# Patient Record
Sex: Female | Born: 1970 | Race: Black or African American | Hispanic: No | Marital: Single | Smoking: Never smoker
Health system: Southern US, Community
[De-identification: ages and names within clinical notes are randomized; demographics above are authoritative.]

## PROBLEM LIST (undated history)

## (undated) DIAGNOSIS — R053 Chronic cough: Secondary | ICD-10-CM

## (undated) DIAGNOSIS — E7439 Other disorders of intestinal carbohydrate absorption: Secondary | ICD-10-CM

## (undated) DIAGNOSIS — R600 Localized edema: Secondary | ICD-10-CM

## (undated) DIAGNOSIS — R05 Cough: Secondary | ICD-10-CM

## (undated) HISTORY — DX: Chronic cough: R05.3

## (undated) HISTORY — DX: Localized edema: R60.0

## (undated) HISTORY — DX: Other disorders of intestinal carbohydrate absorption: E74.39

## (undated) HISTORY — DX: Cough: R05

## (undated) HISTORY — DX: Morbid (severe) obesity due to excess calories: E66.01

---

## 1997-06-21 ENCOUNTER — Inpatient Hospital Stay (HOSPITAL_COMMUNITY): Admission: AD | Admit: 1997-06-21 | Discharge: 1997-06-23 | Payer: Self-pay | Admitting: *Deleted

## 1999-01-02 ENCOUNTER — Other Ambulatory Visit: Admission: RE | Admit: 1999-01-02 | Discharge: 1999-01-02 | Payer: Self-pay | Admitting: *Deleted

## 1999-05-18 ENCOUNTER — Ambulatory Visit (HOSPITAL_COMMUNITY): Admission: AD | Admit: 1999-05-18 | Discharge: 1999-05-18 | Payer: Self-pay | Admitting: Obstetrics and Gynecology

## 1999-05-20 ENCOUNTER — Encounter: Payer: Self-pay | Admitting: Obstetrics and Gynecology

## 1999-05-20 ENCOUNTER — Inpatient Hospital Stay (HOSPITAL_COMMUNITY): Admission: AD | Admit: 1999-05-20 | Discharge: 1999-05-20 | Payer: Self-pay | Admitting: Obstetrics and Gynecology

## 1999-05-21 ENCOUNTER — Inpatient Hospital Stay (HOSPITAL_COMMUNITY): Admission: AD | Admit: 1999-05-21 | Discharge: 1999-05-21 | Payer: Self-pay | Admitting: Obstetrics & Gynecology

## 1999-05-25 ENCOUNTER — Encounter (INDEPENDENT_AMBULATORY_CARE_PROVIDER_SITE_OTHER): Payer: Self-pay | Admitting: Specialist

## 1999-05-25 ENCOUNTER — Inpatient Hospital Stay (HOSPITAL_COMMUNITY): Admission: AD | Admit: 1999-05-25 | Discharge: 1999-05-29 | Payer: Self-pay | Admitting: *Deleted

## 1999-06-02 ENCOUNTER — Inpatient Hospital Stay (HOSPITAL_COMMUNITY): Admission: AD | Admit: 1999-06-02 | Discharge: 1999-06-03 | Payer: Self-pay | Admitting: Obstetrics and Gynecology

## 2000-06-05 ENCOUNTER — Encounter: Payer: Self-pay | Admitting: Obstetrics and Gynecology

## 2000-06-05 ENCOUNTER — Ambulatory Visit (HOSPITAL_COMMUNITY): Admission: RE | Admit: 2000-06-05 | Discharge: 2000-06-05 | Payer: Self-pay | Admitting: Obstetrics and Gynecology

## 2001-05-19 ENCOUNTER — Ambulatory Visit (HOSPITAL_COMMUNITY): Admission: RE | Admit: 2001-05-19 | Discharge: 2001-05-19 | Payer: Self-pay | Admitting: Gastroenterology

## 2001-08-29 ENCOUNTER — Other Ambulatory Visit: Admission: RE | Admit: 2001-08-29 | Discharge: 2001-08-29 | Payer: Self-pay | Admitting: Obstetrics and Gynecology

## 2002-02-20 ENCOUNTER — Other Ambulatory Visit: Admission: RE | Admit: 2002-02-20 | Discharge: 2002-02-20 | Payer: Self-pay | Admitting: Physical Therapy

## 2003-03-09 ENCOUNTER — Other Ambulatory Visit: Admission: RE | Admit: 2003-03-09 | Discharge: 2003-03-09 | Payer: Self-pay | Admitting: Obstetrics and Gynecology

## 2004-04-18 ENCOUNTER — Other Ambulatory Visit: Admission: RE | Admit: 2004-04-18 | Discharge: 2004-04-18 | Payer: Self-pay | Admitting: Obstetrics and Gynecology

## 2007-01-30 ENCOUNTER — Ambulatory Visit (HOSPITAL_COMMUNITY): Admission: RE | Admit: 2007-01-30 | Discharge: 2007-01-30 | Payer: Self-pay | Admitting: Obstetrics and Gynecology

## 2010-05-25 ENCOUNTER — Ambulatory Visit
Admission: RE | Admit: 2010-05-25 | Discharge: 2010-05-25 | Disposition: A | Discharge status: Home / Self Care | Payer: BC Managed Care – PPO | Source: Ambulatory Visit | Attending: Obstetrics and Gynecology | Admitting: Obstetrics and Gynecology

## 2010-05-25 ENCOUNTER — Other Ambulatory Visit: Payer: Self-pay | Admitting: Obstetrics and Gynecology

## 2010-05-25 DIAGNOSIS — R05 Cough: Secondary | ICD-10-CM

## 2010-09-08 NOTE — Discharge Summary (Signed)
[facility]  Patient:    Katrina Howe, Katrina Howe                      MRN: [id number] Adm. Date:  [id number] Disc. Date: [id number] Attending:  Maxie Better                           Discharge Summary  ADMISSION DIAGNOSES:          1. Wound infection.                               2. Severe iron deficiency anemia.                               3. Status post ruptured right ectopic pregnancy.  DISCHARGE DIAGNOSES:          1. Wound infection, resolving.                               2. Severe iron deficiency anemia.                               3. Status post ruptured right ectopic pregnancy.  HISTORY OF PRESENT ILLNESS:   This is a 40 year old, gravida 4, para 2-0-2-2, female, status post a diagnostic laparoscopy, exploratory laparotomy with bilateral salpingectomy on May 25, 1999, secondary to ruptured right ectopic pregnancy who presented to maternity admissions unit for evaluation of temperature of 100.8. The patient had persistent temperatures since 12 noon today. She was complaining of lower abdominal pain and right groin pain without any urinary symptoms or upper  respiratory tract infection.  HOSPITAL COURSE:              The patient was initially evaluated at maternity admissions unit at the time that she presented her temperature was 100.8. Complete physical examination was performed.  It was most notable for her abdomen which as soft, slightly obese.  Her infraumbilical incision was without erythema.  Her low transverse incision had Steri-Strips in place, but there was evidence of some purulence at a small pinpoint site.  This was probed with a wound culturet. Copious purulent material was obtained.  Pelvic examination showed her mons pubis without any erythema.  Hyperpigmented vulva without any edema.  Bimanual examination with no cervical motion tenderness, tender in the left lower quadrant. The right was nontender, but the  examination was limited due to the patients discomfort.  Her abdominal incision was injected with 1% lidocaine and opened approximately two inches with a scalpel.  Purulent material was noted.  The incision was irrigated with half strength hydrogen peroxide packed with Caltostat. CBC, urinalysis had been obtained.  The white count was 12.9, hemoglobin was 6.6, hematocrit 21.3, platelet count 595,000.  Urine showed a specific gravity of 1.027, trace ketones, no leukocyte esterase, and negative for nitrites.  The patient was started on intravenous Amoxicillin and gentamicin.  She continued on her Niferex 150 mg p.o. b.i.d. and was placed for 23-hour observation.  The patient on hospital day #2, noted resolution of her pain since the opening of her incision.  She became afebrile.  The wound culture subsequently revealed a few gram positive cocci, rare gram negative rods.  She was continued on her antibiotics.  The patient remained afebrile.  On June 03, 1999, having remained afebrile, the patient otherwise felt well.  She was complaining of some burning with urination.  Urine culture rom admission was negative.  The incision otherwise was unremarkable, had no drainage or erythema.  Good granulation tissue present.  The patient was deemed well for  discharge.  She was discharged home.  DISCHARGE MEDICATIONS:        1. Amoxicillin 500 mg p.o. t.i.d. for 10 days.                               2. Flagyl 375 mg one p.o. t.i.d. for 10 days.                               3. Continue on iron supplementation at home.  Dressing change from Home Health twice per day.  DISPOSITION:                  Home.  CONDITION ON DISCHARGE:       Stable.  FOLLOW-UP:                    Follow-up appointment in the coming week with Genia Del, M.D.  DISCHARGE INSTRUCTIONS:       Call for temperature greater than or equal to 100.4, increased incisional pain, purulent drainage, redness,  bleeding from her incision site, severe abdominal pain, nausea and vomiting. DD:  06/24/99 TD:  06/26/99 Job: [id number]

## 2010-09-08 NOTE — Op Note (Signed)
[facility]  Patient:    Katrina Howe                       MRN: [id number] Proc. Date: 05/25/99 Adm. Date:  [id number] Attending:  Ardeen Fillers                           Operative Report  PREOPERATIVE DIAGNOSIS:       Ruptured ectopic pregnancy status post methotrexate.  POSTOPERATIVE DIAGNOSIS:      Ruptured ectopic pregnancy status post methotrexate.                               Existing hemoperitoneum, 1100 cc.                               Left peritubal cyst.  OPERATION:                    Laparoscopy.  Bilateral salpingectomy via laparotomy.  SURGEON:                      Silverio Lay, M.D.  ASSISTANT:  ANESTHESIA:                   General with endotracheal intubation.  ESTIMATED BLOOD LOSS:         400 cc for total blood of 1500 cc  INDICATIONS:  DESCRIPTION OF PROCEDURE:     After being informed of the intent of right salpingectomy and left bilateral tubal ligation via laparoscopy with possible need for laparotomy, and being informed of the possible complications including bleeding, infection and trauma to the other organs, as well as failure rate for the tubal ligation of 1:250 to 1:500 with irreversibility informed consent was obtained. The patient was taken to OR #2, given general anesthesia and endotracheal intubation. She was placed in the lithotomy position, prepped and draped in the  sterile fashion.  A Foley catheter was inserted. A speculum was inserted and the anterior lip of the cervix was grasped to place an intrauterine manipulator.  A  1 cm incision was made in the umbilical area for insertion of the Verres needle and insufflation of 2.5 liters of CO2 for a maximum pressure of 50 mmHg.  Verres needle was removed and 10 mm trocar with laparoscope mounted on video were inserted. Observation:  It is possible to evaluate the pelvic anatomy at this time for there is too much clotted blood present.  A 10  mm incision was made in the suprapubic  area for insertion of a second 10 mm trocar under direct visualization and a 5 m trocar was inserted in the right lower quadrant under direct visualization.  We  started evacuating blood clots with the large suction to identify tube which was now actively bleeding. Visualization was very difficult.  Clotted blood was difficult to remove and it was impossible to safely proceed with the salpingectomy despite two short duration tries.  Decision was made to proceed with laparotomy due to the active bleeding seen by a laparoscopy.  All three trocars were removed.  Skin, subcutaneous tissue and fat were incised in a Pfannenstiel way over the suprapubic trocar incision.  Fascia was incised in a low transverse fashion. Linea alba was dissected and peritoneum was entered in the  midline fashion.  Clots were removed with suction in hand. The right tube was identified promptly clamped with two Kelly clamps.  Then clot and blood was removed with suction. The tube was completely excised and the tubal stalk was sutured with two transfixion sutures of 0 Vicryl.  Hemostasis was adequate.  A self retaining retractor was then placed. Bowels were retracted with large sponge and the left tube was then identified. We noted at this point the presence of a peritubal cyst at the fimbrial end of the  tube which was about 1 cm and decision was made to proceed with salpingectomy on that side also.  Mesosalpinx was then clamped with four Kelly clamps.  The tube was excised and all clamps were sutured with a transfix suture of 0 Vicryl. Hemostasis was assessed and adequate.  We proceeded to evacuate all clots with irrigation.  During that appendix was seen and normal.  All sponges were removed from the abdomen with the trocar.  Under fascia hemostasis was obtained with cautery. Fascia was closed with two running sutures of 0 Vicryl meeting at midline. Skin of all  incision was infiltrated with Marcaine 0.25.  Irrigated with saline and skin was then closed with subcuticular running suture of 4-0 Monocryl and Steri-Strips. The uterine instruments were then removed.  Estimated blood loss total is 1500 c which is 400 cc active loss during procedure.  Instrument and sponge count is complete times two.  The procedure is well tolerated by the patient who is taken to the recovery room in a well and stable condition. DD:  05/25/99 TD:  05/26/99 Job: [id number]

## 2010-09-08 NOTE — Discharge Summary (Signed)
[facility]  Patient:    Katrina Howe, Katrina Howe                      MRN: [id number] Adm. Date:  [id number] Disc. Date: [id number] Attending:  Maxie Better                           Discharge Summary  REASON FOR ADMISSION:         Suspected ruptured ectopic pregnancy.  HISTORY OF PRESENT ILLNESS:   This is a 40 year old black female, gravida 4, para 2, abortus 1, with a last menstrual period of April 12, 1999, with a known right ectopic pregnancy, treated with methotrexate on May 18, 1999.  Quantitative hCG was slowly decreasing with a quantitative on January 25, of 34,918, on January 8, 22,399, and on January 31, 17,554.  She was admitted complaining of slow onset lower abdominal pain which began earlier in the morning, felt as cramping in the right more than the left lower quadrant, radiating around the back and is continuous.  She denied any shoulder pain.  Her physical examination revealed positive rebound in the lower abdomen and impossible bimanual examination for reason of pain which was compatible with a ruptured ectopic pregnancy.  She was consented for emergency laparoscopy by Sung Amabile. Roslyn Smiling, M.D. and discussed with Sung Amabile. Roslyn Smiling, M.D. bilateral tubal ligation which was known to be irreversible with a failure rate of 1 in 250 to 1 in 500.  HOSPITAL COURSE:              She underwent laparoscopy with bilateral salpingectomy via laparotomy on May 25, 1999.  Preexisting hemoperitoneum and active bleeding interfering with possible laparoscopy.  The surgery went without complications and the estimated blood loss during the procedure was 400 cc with the preexisting hemoperitoneum of 1100.  The patient remained stable throughout the  procedure which was well tolerated.  Her postoperative course was uneventful despite her postoperative hemoglobin of 5.4.  That severe anemia was surprisingly well tolerated.  Her hemoglobin  remained stable and was again tested on May 28, 1999, at 5.7.  She was discharged on May 29, 1999, in a well and stable condition having a follow-up appointment with Sung Amabile. Roslyn Smiling, M.D. on May 26, 1999.  She received a Tylox prescription for pain control and Chromagen for anemia. She was instructed to call back if experiencing increased abdominal pain or fever.  DISCHARGE DIAGNOSES:          1. Ruptured ectopic pregnancy post methotrexate                                  treatment.                               2. Severe anemia.                               3. Sterilization per patient request.  CONDITION ON DISCHARGE:       Well and stable. DD:  09/04/99 TD:  09/05/99 Job: [id number]

## 2010-09-08 NOTE — Procedures (Signed)
[facility]  Patient:    Katrina Howe, Katrina Howe Visit Number: [id number] MRN: [id number]          Service Type: END Location: ENDO Attending Physician:  Louie Bun Dictated by:   Everardo All Madilyn Fireman, M.D. Proc. Date: 05/19/01 Admit Date:  05/19/2001                             Procedure Report  PROCEDURE:  Esophagogastroduodenoscopy with biopsy.  INDICATION FOR PROCEDURE:  Epigastric abdominal pain of one months duration with ulcer-like features, failing to respond to acid suppression.  DESCRIPTION OF PROCEDURE:  The patient was placed in the left lateral decubitus position and placed on the pulse monitor with continuous low-flow oxygen delivered by nasal cannula.  She was sedated with 50 mg IV Demerol and 5 mg IV Versed.  The Olympus video endoscope was advanced under direct vision into the oropharynx and esophagus.  The esophagus was straight and of normal caliber with the squamocolumnar line at 38 cm.  There was no visible hiatal hernia, ring, stricture, or other abnormality of the GE junction or distal esophagus.  The stomach was entered, and a small amount of liquid secretions were suctioned from the fundus.  Retroflexed view of the cardia was unremarkable.  The fundus and body appeared normal.  The antrum showed several focal erosions with small amounts of exudate and general patchy erythema and granularity consistent with a moderate gastritis.  No definite ulcer was seen, and none of the erosions had any significant depth or elevation surrounding the areas of exudate.  A CLOtest was obtained.  The pylorus was nondeformed and easily allowed passage of the endoscope tip into the duodenum.  Both the bulb and second portion were well-inspected and appeared to be within normal limits.  The scope was then withdrawn, and the patient returned to the recovery room in stable condition.  She tolerated the procedure well, and there were no immediate  complications.  IMPRESSION:  Moderate antral gastritis.  PLAN:  Await CLOtest and will either treat with proton pump inhibitor with avoidance of nonsteroidal anti-inflammatory drugs or antibiotics to treat Helicobacter if present. Dictated by:   Everardo All Madilyn Fireman, M.D. Attending Physician:  Louie Bun DD:  05/19/01 TD:  05/19/01 Job: [id number]

## 2013-09-23 ENCOUNTER — Ambulatory Visit: Payer: BC Managed Care – PPO | Admitting: Interventional Cardiology

## 2013-11-06 ENCOUNTER — Ambulatory Visit: Payer: BC Managed Care – PPO | Admitting: Interventional Cardiology

## 2013-12-18 ENCOUNTER — Ambulatory Visit (INDEPENDENT_AMBULATORY_CARE_PROVIDER_SITE_OTHER): Payer: BC Managed Care – PPO | Admitting: Interventional Cardiology

## 2013-12-18 ENCOUNTER — Encounter: Payer: Self-pay | Admitting: Interventional Cardiology

## 2013-12-18 VITALS — BP 124/66 | HR 60 | Ht 61.0 in | Wt 191.0 lb

## 2013-12-18 DIAGNOSIS — R079 Chest pain, unspecified: Secondary | ICD-10-CM | POA: Insufficient documentation

## 2013-12-18 DIAGNOSIS — E668 Other obesity: Secondary | ICD-10-CM

## 2013-12-18 DIAGNOSIS — E669 Obesity, unspecified: Secondary | ICD-10-CM

## 2013-12-18 HISTORY — DX: Chest pain, unspecified: R07.9

## 2013-12-18 NOTE — Progress Notes (Signed)
Patient ID: Katrina Howe, female   DOB: 1971/04/18, 43 y.o.   MRN: [id number]   Date: 12/18/2013 ID: Katrina Howe, DOB [birth year], MRN [id number] PCP: Turner Daniels, MD  Reason: Chest discomfort  ASSESSMENT;  1. exertional chest tightness, substernal 2. Obesity 3. History of glucose intolerance  PLAN:  1. stress Cardiolite   SUBJECTIVE: Katrina Howe is a 43 y.o. female who is here for evaluation of chest discomfort. It is characterized as a tightness in the substernal region. He can last up to hours. She denies palpitations. There is no orthopnea or PND. There is no peripheral edema or radiation of the discomfort to the neck or back. This symptom has been present and bothersome for approximately 12 months.  She's had a history of obesity. She has been on phentermine in the past. She denies peripheral edema, PE, and syncope.  She has never smoked cigarettes. There is a family history of coronary disease. A first cousin recently died of CAD at age 87.   Allergies  Allergen Reactions  . Iohexol      Code: HIVES, Desc: Pt.had omnipaque 300% IV injection (rt.antecubital site) around 12:35pm for CT CHEST,ABDOMEN,PELVIS scan. Left side of upper lip started to swell immediately after scan. Dr. Britta Mccreedy had prescibed and dosed pt. with . benadryl p.o. around 13, Onset Date: [id number]     No current outpatient prescriptions on file prior to visit.   No current facility-administered medications on file prior to visit.    No past medical history on file.  No past surgical history on file.  History   Social History  . Marital Status: Married    Spouse Name: N/A    Number of Children: N/A  . Years of Education: N/A   Occupational History  . Not on file.   Social History Main Topics  . Smoking status: Never Smoker   . Smokeless tobacco: Not on file  . Alcohol Use: Not on file  . Drug Use: Not on file  . Sexual Activity: Not on file   Other  Topics Concern  . Not on file   Social History Narrative  . No narrative on file    No family history on file.  ROS: Denies melena, musculoskeletal discomfort, connective tissue disease, joint discomfort, stroke, claudication, and kidney disease.. Other systems negative for complaints.  OBJECTIVE: BP 124/66  Pulse 60  Ht  (1.549 m)  Wt 191 lb (86.637 kg)  BMI 36.11 kg/m2,  General: No acute distress, obese but healthy HEENT: normal no pallor or jaundice Neck: JVD flat. Carotids absent bruits. 2+ symmetric upstroke. Chest: Clear Cardiac: Murmur: Normal. Gallop: Absent. Rhythm: Normal. Other: Normal Abdomen: Bruit: Absent. Pulsation: 2+ and symmetric Extremities: Edema: Absent. Pulses: 2+ and symmetric Neuro: Normal Psych: Normal  ECG: Normal sinus rhythm with nonspecific ST abnormality. Lateral Q waves raising the question of lateral infarction.

## 2013-12-18 NOTE — Patient Instructions (Signed)
Your physician has requested that you have an exercise stress myoview. For further information please visit [email or web address] Please follow instruction sheet, as given.  We will determine further follow-up plans after your myoview.

## 2014-01-01 ENCOUNTER — Telehealth: Payer: Self-pay | Admitting: Interventional Cardiology

## 2014-01-01 NOTE — Telephone Encounter (Signed)
Had left several messages to call and schedule Myoview.   Spoke with patient today and she stated she can' do until she has a day off work.

## 2015-05-10 ENCOUNTER — Ambulatory Visit (INDEPENDENT_AMBULATORY_CARE_PROVIDER_SITE_OTHER): Payer: BLUE CROSS/BLUE SHIELD | Admitting: Podiatry

## 2015-05-10 ENCOUNTER — Ambulatory Visit (INDEPENDENT_AMBULATORY_CARE_PROVIDER_SITE_OTHER): Payer: BLUE CROSS/BLUE SHIELD

## 2015-05-10 ENCOUNTER — Encounter: Payer: Self-pay | Admitting: Podiatry

## 2015-05-10 VITALS — BP 136/76 | HR 83 | Resp 16

## 2015-05-10 DIAGNOSIS — M722 Plantar fascial fibromatosis: Secondary | ICD-10-CM

## 2015-05-10 DIAGNOSIS — M79673 Pain in unspecified foot: Secondary | ICD-10-CM | POA: Diagnosis not present

## 2015-05-10 MED ORDER — MELOXICAM 15 MG PO TABS: 15.0000 mg | ORAL_TABLET | Freq: Every day | ORAL | Status: DC

## 2015-05-10 MED ORDER — METHYLPREDNISOLONE 4 MG PO TBPK: ORAL_TABLET | ORAL | Status: DC

## 2015-05-10 NOTE — Patient Instructions (Signed)

## 2015-05-10 NOTE — Progress Notes (Signed)
   Subjective:    Patient ID: Katrina Howe, female    DOB: [birth year], 45 y.o.   MRN: [id number]  HPI: She presents today with a 7 month duration of pain to the bilateral lower extremity. She states that the heel has been bothering her for the past several months left greater than right particularly in the mornings or after she's been sitting. She's done nothing to try to treat.    Review of Systems  Musculoskeletal: Positive for gait problem.  All other systems reviewed and are negative.      Objective:   Physical Exam: 45 year old female also stable alert and oriented 3 no apparent distress. Pulses are strongly palpable bilateral neurologic sensorium is intact per Semmes-Weinstein monofilament. Deep tendon reflexes are intact muscle strength +5 over 5 dorsiflexion plantar flexors and inverters everters all intrinsic musculature is intact. Orthopedic evaluation demonstrates pain on palpation medial calcaneal tubercle bilateral. She has no pain on medial and lateral compression of the calcaneus. Radiographs 3 views bilateral foot demonstrates an osseously mature individual with plantar distally oriented calcaneal heel spurs and a soft tissue increase in density at the plantar fascial calcaneal insertion site. Cutaneous evaluation demonstrates supple well-hydrated cutis no erythema edema cellulitis drainage or odor.        Assessment & Plan:  Assessment: Plantar fasciitis bilateral.  Plan: Started her on a Medrol Dosepak today to be followed by meloxicam. Injected the bilateral heels today with Kenalog and local anesthetic. Put her in a plantar fascial brace. She already has a night splint to utilize. We discussed appropriate shoe gear stretching exercises ice therapy and sugar modifications. I will follow-up with her in 1 month.

## 2015-06-09 ENCOUNTER — Encounter (INDEPENDENT_AMBULATORY_CARE_PROVIDER_SITE_OTHER): Payer: BLUE CROSS/BLUE SHIELD | Admitting: Podiatry

## 2015-06-09 NOTE — Progress Notes (Signed)
This encounter was created in error - please disregard.

## 2017-03-08 ENCOUNTER — Ambulatory Visit (INDEPENDENT_AMBULATORY_CARE_PROVIDER_SITE_OTHER): Payer: Managed Care, Other (non HMO) | Admitting: Podiatry

## 2017-03-08 ENCOUNTER — Ambulatory Visit (INDEPENDENT_AMBULATORY_CARE_PROVIDER_SITE_OTHER): Payer: Managed Care, Other (non HMO)

## 2017-03-08 ENCOUNTER — Ambulatory Visit: Payer: Managed Care, Other (non HMO)

## 2017-03-08 ENCOUNTER — Encounter: Payer: Self-pay | Admitting: Podiatry

## 2017-03-08 DIAGNOSIS — M25571 Pain in right ankle and joints of right foot: Secondary | ICD-10-CM

## 2017-03-08 DIAGNOSIS — M722 Plantar fascial fibromatosis: Secondary | ICD-10-CM | POA: Diagnosis not present

## 2017-03-08 MED ORDER — TRIAMCINOLONE ACETONIDE 10 MG/ML IJ SUSP
10.0000 mg | Freq: Once | INTRAMUSCULAR | Status: AC
  Administered 2017-03-08: 10 mg

## 2017-03-08 MED ORDER — DICLOFENAC SODIUM 75 MG PO TBEC: 75.0000 mg | DELAYED_RELEASE_TABLET | Freq: Two times a day (BID) | ORAL | 2 refills | Status: DC

## 2017-03-08 NOTE — Patient Instructions (Signed)

## 2017-03-13 NOTE — Progress Notes (Signed)
Subjective:    Patient ID: Katrina Howe Katrina Howe, female   DOB: 46 y.o.   MRN: [id number]   HPI patient states the bottom of the heels and become quite sore again and they continue to make it hard for me to walk    ROS      Objective:  Physical Exam neurovascular status intact with patient found have inflammatory changes plantar aspect of the heel bilateral with inflammation fluid around the medial band     Assessment:   Acute plantar fasciitis bilateral      Plan:   I injected the plantar fascia bilateral 3 mg Kenalog 5 mg Xylocaine and applied fascial brace bilateral gave instructions on physical therapy and supportive therapy

## 2017-03-29 ENCOUNTER — Ambulatory Visit: Payer: Managed Care, Other (non HMO) | Admitting: Podiatry

## 2017-10-02 ENCOUNTER — Encounter: Payer: Self-pay | Admitting: Cardiology

## 2017-11-22 ENCOUNTER — Encounter: Payer: Self-pay | Admitting: Cardiology

## 2017-11-22 ENCOUNTER — Ambulatory Visit (INDEPENDENT_AMBULATORY_CARE_PROVIDER_SITE_OTHER): Payer: Managed Care, Other (non HMO) | Admitting: Cardiology

## 2017-11-22 VITALS — BP 131/83 | HR 75 | Ht 61.0 in | Wt 216.6 lb

## 2017-11-22 DIAGNOSIS — R059 Cough, unspecified: Secondary | ICD-10-CM

## 2017-11-22 DIAGNOSIS — R609 Edema, unspecified: Secondary | ICD-10-CM | POA: Diagnosis not present

## 2017-11-22 DIAGNOSIS — R0609 Other forms of dyspnea: Secondary | ICD-10-CM | POA: Diagnosis not present

## 2017-11-22 DIAGNOSIS — R05 Cough: Secondary | ICD-10-CM | POA: Diagnosis not present

## 2017-11-22 DIAGNOSIS — R079 Chest pain, unspecified: Secondary | ICD-10-CM | POA: Diagnosis not present

## 2017-11-22 NOTE — Progress Notes (Signed)
PCP: Candice Camp, MD  Clinic Note: Chief Complaint  Patient presents with  . New Patient (Initial Visit)    chest pain, cough, swelling    HPI: Katrina Howe is a 47 y.o. female who is being seen today for the evaluation of CHEST PAIN, EDEMA & CHRONIC COUGH at the request of her OB Gyn- Candice Camp, MD.  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  She is concerned about CHF b/c her boyfriend was recently diagnosed with cardiomyopathy & is a patient of Dr. Gala Romney in the CHF clinic.  Her son also has diagnosis of left ventricular non-compaction  She was previously seen by Dr. Garnette Scheuermann in Aug 2015 for exertional CP (substernal tightness) -- with a PMH notable for obesity & glucose intolerance & a Fam Hx of CAD, he ordered a TM Myoview -- was never done  Katrina Howe was seen on June 11 by Dr. Rana Snare for her annual exam -- she noted concern for cough & pedal edema.  Also noted urinary incontinence & wgt gain. On ROS - noted occasional chest tightness - usually associated with her coughing & moving patients.   Recent Hospitalizations: none   Studies Personally Reviewed - (if available, images/films reviewed: From Epic Chart or Care Everywhere)  none  Interval History: Katrina Howe presents here today for a cardiology checkup because of some chest pain episodes that she is been having. Noted was a cough that is been ongoing for several months.  She says this is gotten better since she started taking Pepcid and Delsym.  She also was concerned because of increasing pedal edema and weight gain.  She says that her feet get swollen near the end of the day, but usually goes down the morning. She denies any PND orthopnea.  She also admits to probably not eating a very healthy diet, overeating and lack of exercise.  Basically with her busy work schedule between 2 jobs, she just does not cannot exercise.  She is always having to move  patient's equipment and sometimes has some tightness in her chest when she is doing this or when she is over exerting herself. She describes this pain as a tightness feeling, it goes across her upper chest to her shoulders.  It will last about 1 or 2 minutes.  There is some days worse worsen some days worse better.  Is not always associated with exertion.  Remainder of cardiac risk symptoms: No palpitations, lightheadedness, dizziness, weakness or syncope/near syncope. No TIA/amaurosis fugax symptoms. No claudication.  ROS: A comprehensive was performed. Review of Systems  HENT: Negative for congestion and nosebleeds.   Respiratory: Positive for cough (Much better with Pepcid and Delsym). Negative for shortness of breath.   Gastrointestinal: Negative for blood in stool and melena.  Genitourinary: Positive for urgency (Some urge incontinence with coughing and sneezing). Negative for hematuria.  Musculoskeletal: Negative for back pain and joint pain.  Neurological: Negative for dizziness and focal weakness.  Psychiatric/Behavioral: Negative.   All other systems reviewed and are negative.  I have reviewed and (if needed) personally updated the patient's problem list, medications, allergies, past medical and surgical history, social and family history.   Past Medical History:  Diagnosis Date  . Chronic cough   . Glucose intolerance   . Leg edema   . Morbid obesity (HCC)     History reviewed. No pertinent surgical history.  No outpatient medications have been marked  as taking for the 11/22/17 encounter (Office Visit) with Marykay Lex, MD.    Allergies  Allergen Reactions  . Iohexol      Code: HIVES, Desc: Pt.had omnipaque 300% IV injection (rt.antecubital site) around 12:35pm for CT CHEST,ABDOMEN,PELVIS scan. Left side of upper lip started to swell immediately after scan. Dr. Britta Mccreedy had prescibed and dosed pt. with 50mg . benadryl p.o. around 13, Onset Date: [id number]      Social History   Tobacco Use  . Smoking status: Never Smoker  . Smokeless tobacco: Never Used  Substance Use Topics  . Alcohol use: Yes    Comment: occasional  . Drug use: Never   Social History   Social History Narrative   She is a Lawyer - works 2 jobs.   No time to exercise.   Does not smoke.  Drinks alcohol socially.    family history includes Heart disease in her mother and son; Heart failure in her maternal grandmother.  Wt Readings from Last 3 Encounters:  11/22/17 216 lb 9.6 oz (98.2 kg)  12/18/13 191 lb (86.6 kg)    PHYSICAL EXAM BP 131/83 (BP Location: Left Arm)   Pulse 75   Ht 5\' 1"  (1.549 m)   Wt 216 lb 9.6 oz (98.2 kg)   BMI 40.93 kg/m  Physical Exam  Constitutional: She is oriented to person, place, and time. She appears well-developed and well-nourished. No distress.  By criteria, will be obese, but she does not have the classic body habitus of morbid obesity.  HENT:  Head: Normocephalic and atraumatic.  Mouth/Throat: No oropharyngeal exudate.  Eyes: Pupils are equal, round, and reactive to light. Conjunctivae and EOM are normal. No scleral icterus.  Neck: Normal range of motion. Neck supple. No hepatojugular reflux and no JVD present. Carotid bruit is not present.  Cardiovascular: Normal rate, regular rhythm, normal heart sounds and intact distal pulses.  No extrasystoles are present. PMI is not displaced (Difficult to assess). Exam reveals no gallop and no friction rub.  No murmur heard. Pulmonary/Chest: Effort normal and breath sounds normal. No respiratory distress. She has no wheezes. She has no rales. She exhibits tenderness (She does a couple areas of point tenderness along the sternal border.).  Abdominal: Soft. Bowel sounds are normal. She exhibits no distension. There is no tenderness. There is no rebound.  Musculoskeletal: Normal range of motion. She exhibits no edema.  Neurological: She is alert and oriented to person, place, and time. No  cranial nerve deficit.  Skin: Skin is warm and dry. No rash noted. No erythema.  Psychiatric: She has a normal mood and affect. Her behavior is normal. Judgment and thought content normal.  Vitals reviewed.    Adult ECG Report  Rate: 75 ;  Rhythm: normal sinus rhythm and Normal axis,  intervals, and durations;   Narrative Interpretation: Normal EKG   Other studies Reviewed: Additional studies/ records that were reviewed today include:  Recent Labs: Hemoglobin A1c was checked, however I cannot believe the results.  October 02, 2017: Lipid panel: TC 167, TG 170, HDL 53, LDL 93.  TSH 2.08.  Glucose 102. Na+ 138, K+ 4.3, Cl- 106, HCO3- 27, BUN 8, Cr 0.65, Glu 102, Ca2+ 8.9; AST 15, ALT 16, AlkP 64,      ASSESSMENT / PLAN: Problem List Items Addressed This Visit    Chest pain    Chest pain sounds very musculoskeletal in nature.  However she does have a family history of coronary disease, and has  glucose intolerance as well as obesity as risk factors.  Plan: Evaluate with GXT (GRADUAL EXERCISE TOLERANCE TEST)      Relevant Orders   EKG 12-Lead   ECHOCARDIOGRAM COMPLETE   EXERCISE TOLERANCE TEST (ETT)   Cough    Interestingly, this seems to have improved with the treatment of GERD as well as cough suppressant.  Again, to exclude CHF/cardiomyopathy, will check 2D echocardiogram.      Relevant Orders   EKG 12-Lead   ECHOCARDIOGRAM COMPLETE   EXERCISE TOLERANCE TEST (ETT)   DOE (dyspnea on exertion)    Exertional dyspnea is probably related to obesity and deconditioning.  I would like to build see her exercise tolerance, but she would also want to ensure no evidence of cardia myopathy. Plan: 2D echocardiogram and GXT.      Relevant Orders   EKG 12-Lead   ECHOCARDIOGRAM COMPLETE   EXERCISE TOLERANCE TEST (ETT)   Swelling - Primary   Relevant Orders   EKG 12-Lead   ECHOCARDIOGRAM COMPLETE   EXERCISE TOLERANCE TEST (ETT)     I spent a total of 30 minutes with the patient and  chart review. >  50% of the time was spent in direct patient consultation.   Current medicines are reviewed at length with the patient today.  (+/- concerns) n/a The following changes have been made:  n/a  Patient Instructions  Your physician has requested that you have an exercise tolerance test. For further information please visit [email or web address]. Please also follow instruction sheet, as given. -- done at Dr. Elissa HeftyHarding's office  Your physician has requested that you have an echocardiogram. Echocardiography is a painless test that uses sound waves to create images of your heart. It provides your doctor with information about the size and shape of your heart and how well your heart's chambers and valves are working. This procedure takes approximately one hour. There are no restrictions for this procedure. -- done at [address]  Your physician recommends that you schedule a follow-up appointment with Dr. Herbie BaltimoreHarding (first available after testing)   Studies Ordered:   Orders Placed This Encounter  Procedures  . EXERCISE TOLERANCE TEST (ETT)  . EKG 12-Lead  . ECHOCARDIOGRAM COMPLETE     Bryan Lemmaavid Keatin Benham, M.D., M.S. Interventional Cardiologist   Pager # [phone] Phone # [phone] [address] [place], [place] [address]   Thank you for choosing [facility]!!

## 2017-11-22 NOTE — Patient Instructions (Signed)
Your physician has requested that you have an exercise tolerance test. For further information please visit [email or web address]. Please also follow instruction sheet, as given. -- done at Dr. Elissa HeftyHarding's office  Your physician has requested that you have an echocardiogram. Echocardiography is a painless test that uses sound waves to create images of your heart. It provides your doctor with information about the size and shape of your heart and how well your heart's chambers and valves are working. This procedure takes approximately one hour. There are no restrictions for this procedure. -- done at [address]  Your physician recommends that you schedule a follow-up appointment with Dr. Herbie BaltimoreHarding (first available after testing)

## 2017-11-24 ENCOUNTER — Encounter: Payer: Self-pay | Admitting: Cardiology

## 2017-11-24 DIAGNOSIS — R0609 Other forms of dyspnea: Secondary | ICD-10-CM

## 2017-11-24 DIAGNOSIS — R05 Cough: Secondary | ICD-10-CM | POA: Insufficient documentation

## 2017-11-24 DIAGNOSIS — R059 Cough, unspecified: Secondary | ICD-10-CM

## 2017-11-24 DIAGNOSIS — R609 Edema, unspecified: Secondary | ICD-10-CM | POA: Insufficient documentation

## 2017-11-24 HISTORY — DX: Other forms of dyspnea: R06.09

## 2017-11-24 HISTORY — DX: Cough, unspecified: R05.9

## 2017-11-24 HISTORY — DX: Edema, unspecified: R60.9

## 2017-11-24 NOTE — Assessment & Plan Note (Signed)
Interestingly, this seems to have improved with the treatment of GERD as well as cough suppressant.  Again, to exclude CHF/cardiomyopathy, will check 2D echocardiogram.

## 2017-11-24 NOTE — Assessment & Plan Note (Signed)
Chest pain sounds very musculoskeletal in nature.  However she does have a family history of coronary disease, and has glucose intolerance as well as obesity as risk factors.  Plan: Evaluate with GXT (GRADUAL EXERCISE TOLERANCE TEST)

## 2017-11-24 NOTE — Assessment & Plan Note (Signed)
Exertional dyspnea is probably related to obesity and deconditioning.  I would like to build see her exercise tolerance, but she would also want to ensure no evidence of cardia myopathy. Plan: 2D echocardiogram and GXT.

## 2017-12-05 ENCOUNTER — Telehealth (HOSPITAL_COMMUNITY): Payer: Self-pay

## 2017-12-05 NOTE — Telephone Encounter (Signed)
Encounter complete. 

## 2017-12-06 ENCOUNTER — Telehealth (HOSPITAL_COMMUNITY): Payer: Self-pay

## 2017-12-06 NOTE — Telephone Encounter (Signed)
Encounter complete. 

## 2017-12-10 ENCOUNTER — Other Ambulatory Visit (HOSPITAL_COMMUNITY): Payer: Managed Care, Other (non HMO)

## 2017-12-10 ENCOUNTER — Ambulatory Visit (HOSPITAL_COMMUNITY)
Admission: RE | Admit: 2017-12-10 | Payer: Managed Care, Other (non HMO) | Source: Ambulatory Visit | Attending: Cardiology | Admitting: Cardiology

## 2017-12-10 DIAGNOSIS — R0989 Other specified symptoms and signs involving the circulatory and respiratory systems: Secondary | ICD-10-CM

## 2018-01-22 ENCOUNTER — Ambulatory Visit: Payer: Managed Care, Other (non HMO) | Admitting: Cardiology

## 2018-01-24 ENCOUNTER — Encounter (HOSPITAL_COMMUNITY): Payer: Self-pay | Admitting: Cardiology

## 2018-02-04 ENCOUNTER — Telehealth: Payer: Self-pay

## 2018-02-04 NOTE — Telephone Encounter (Signed)
New message    Just an FYI. We have made several attempts to contact this patient including sending a letter to schedule or reschedule their echocardiogram. We will be removing the patient from the echo WQ.   Thank you 

## 2018-02-06 NOTE — Telephone Encounter (Signed)
Patient cancelled 01/22/18 appointment to follow up appointment- ( patient did not have either test completed.)

## 2018-12-17 ENCOUNTER — Other Ambulatory Visit: Payer: Self-pay | Admitting: *Deleted

## 2018-12-17 DIAGNOSIS — Z20822 Contact with and (suspected) exposure to covid-19: Secondary | ICD-10-CM

## 2018-12-18 LAB — SPECIMEN STATUS REPORT

## 2018-12-18 LAB — NOVEL CORONAVIRUS, NAA: SARS-CoV-2, NAA: NOT DETECTED

## 2019-04-02 ENCOUNTER — Other Ambulatory Visit: Payer: Self-pay | Admitting: Physical Medicine and Rehabilitation

## 2019-04-02 DIAGNOSIS — G8929 Other chronic pain: Secondary | ICD-10-CM

## 2019-04-02 DIAGNOSIS — M545 Low back pain, unspecified: Secondary | ICD-10-CM

## 2019-04-13 ENCOUNTER — Telehealth: Payer: Self-pay

## 2019-04-13 NOTE — Telephone Encounter (Signed)
Phone call to patient to review instructions for 13 hr prep for Injection w/ contrast on 04/15/2019  at 0900. Prescription called into [facility]. Pt aware and verbalized understanding of instructions. Prescription: 04/14/19 at 8pm- 50mg  Prednisone 04/15/19 at 2 am- 50mg  Prednisone 04/15/19 at 8am - 50mg  Prednisone and 50mg  Benadryl

## 2019-04-14 ENCOUNTER — Telehealth: Payer: Self-pay

## 2019-04-14 NOTE — Telephone Encounter (Signed)
Patients appointment was moved from 04/15/2019 to 04/20/2019 at 0730AM. Called patient to review new times for taking 13 hour prep.   Prescription: 04/19/19 630AM- 50mg  Prednisone 04/20/19 1230AM- 50mg  Prednisone 04/20/19 630AM - 50mg  Prednisone and  50mg  Benadryl  Pt verbalized understanding.

## 2019-04-15 ENCOUNTER — Inpatient Hospital Stay: Admission: RE | Admit: 2019-04-15 | Payer: Self-pay | Source: Ambulatory Visit

## 2019-04-20 ENCOUNTER — Other Ambulatory Visit: Payer: Self-pay

## 2019-04-20 ENCOUNTER — Ambulatory Visit
Admission: RE | Admit: 2019-04-20 | Discharge: 2019-04-20 | Disposition: A | Discharge status: Home / Self Care | Payer: BC Managed Care – PPO | Source: Ambulatory Visit | Attending: Physical Medicine and Rehabilitation | Admitting: Physical Medicine and Rehabilitation

## 2019-04-20 DIAGNOSIS — G8929 Other chronic pain: Secondary | ICD-10-CM

## 2019-04-20 MED ORDER — IOPAMIDOL (ISOVUE-M 200) INJECTION 41%
1.0000 mL | Freq: Once | INTRAMUSCULAR | Status: AC
  Administered 2019-04-20: 1 mL via EPIDURAL

## 2019-04-20 MED ORDER — METHYLPREDNISOLONE ACETATE 40 MG/ML INJ SUSP (RADIOLOG
120.0000 mg | Freq: Once | INTRAMUSCULAR | Status: AC
  Administered 2019-04-20: 08:00:00 120 mg via EPIDURAL

## 2019-04-20 NOTE — Discharge Instructions (Signed)

## 2019-11-02 ENCOUNTER — Other Ambulatory Visit: Payer: Self-pay | Admitting: Sports Medicine

## 2019-11-02 DIAGNOSIS — M545 Low back pain, unspecified: Secondary | ICD-10-CM

## 2019-11-04 ENCOUNTER — Telehealth: Payer: Self-pay

## 2019-11-04 NOTE — Telephone Encounter (Signed)
LMOM for patient after phoning in 13-hr prep to [facility] in her epic chart.  Prednisone 50 mg PO 11/13/19 @ 0030, 0630 and 1230; Benadryl 50 mg PO @ 1230.

## 2019-11-13 ENCOUNTER — Inpatient Hospital Stay: Admission: RE | Admit: 2019-11-13 | Payer: BC Managed Care – PPO | Source: Ambulatory Visit

## 2020-05-14 IMAGING — XA Imaging study
2 series · 2 of 2 positions shown · non-contrast
Comparison: none

CLINICAL DATA: Lumbosacral spondylosis without myelopathy. Central
low back pain without radiculopathy.

[Series 1: ortho adipose · 1 of 1 slices shown (1 of 2)]
[im 1/1]
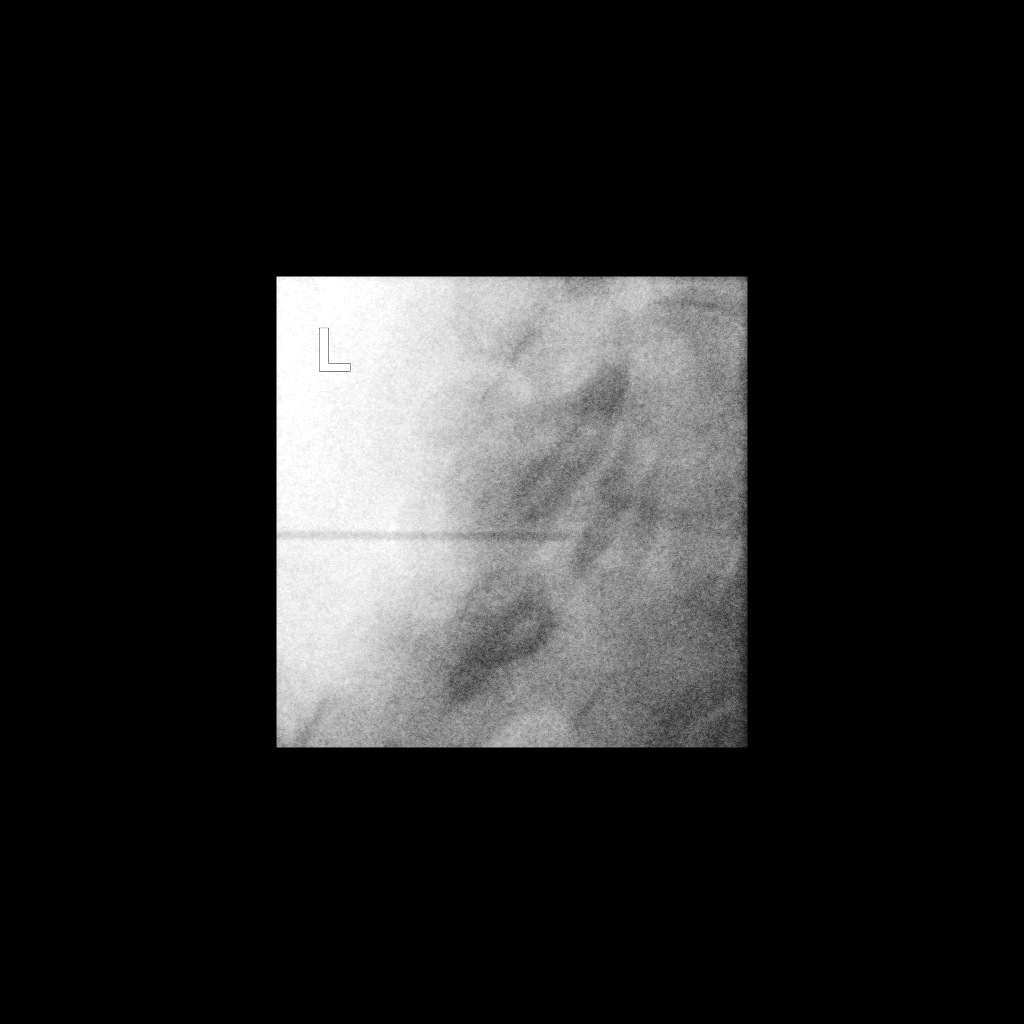

[Series 2: ortho adipose · 1 of 1 slices shown (2 of 2)]
[im 1/1]
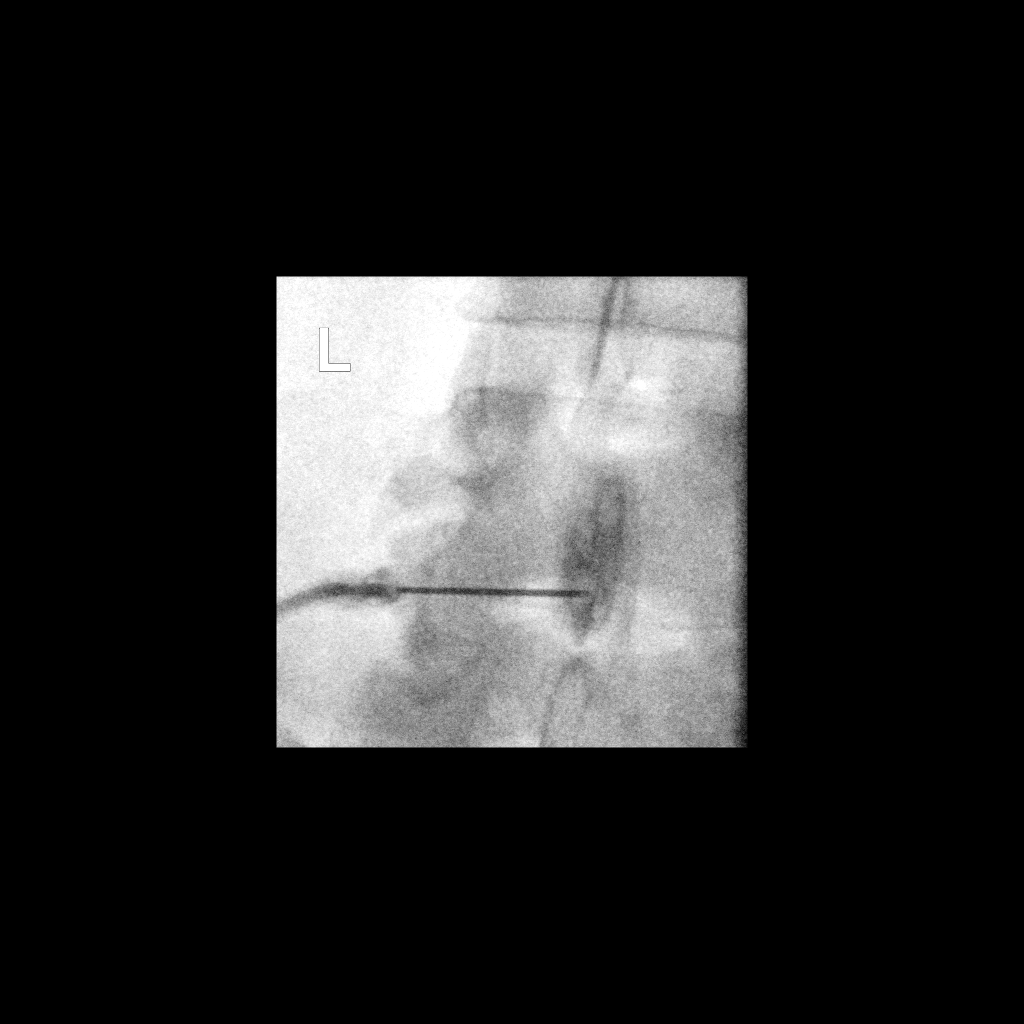

[2 of 2 positions shown; findings below may reference images not displayed]

FLUOROSCOPY TIME:  Radiation Exposure Index (as provided by the
fluoroscopic device): 3.6 mGy

Fluoroscopy Time:  2 seconds

Number of Acquired Images:  0

PROCEDURE:
The procedure, risks, benefits, and alternatives were explained to
the patient. Questions regarding the procedure were encouraged and
answered. The patient understands and consents to the procedure.

LUMBAR EPIDURAL INJECTION:

An interlaminar approach was performed on the left at L4-L5. The
overlying skin was cleansed and anesthetized. A 3.5 inch 20 gauge
epidural needle was advanced using loss-of-resistance technique.

DIAGNOSTIC EPIDURAL INJECTION:

Injection of Isovue-M 200 shows a good epidural pattern with spread
above and below the level of needle placement, primarily in the
midline. No vascular opacification is seen.

THERAPEUTIC EPIDURAL INJECTION:

120 mg of Depo-Medrol mixed with 3 mL of 1% lidocaine were
instilled. The procedure was well-tolerated, and the patient was
discharged thirty minutes following the injection in good condition.

COMPLICATIONS:
None immediate.
IMPRESSION: Technically successful interlaminar epidural injection on the left
at L4-L5.

## 2021-12-21 ENCOUNTER — Other Ambulatory Visit: Payer: Self-pay | Admitting: Obstetrics and Gynecology

## 2021-12-21 ENCOUNTER — Ambulatory Visit
Admission: RE | Admit: 2021-12-21 | Discharge: 2021-12-21 | Disposition: A | Discharge status: Home / Self Care | Payer: 59 | Source: Ambulatory Visit | Attending: Obstetrics and Gynecology | Admitting: Obstetrics and Gynecology

## 2021-12-21 DIAGNOSIS — R7611 Nonspecific reaction to tuberculin skin test without active tuberculosis: Secondary | ICD-10-CM

## 2022-06-09 LAB — LAB REPORT - SCANNED
A1c: 6.6
EGFR: 93

## 2022-08-17 ENCOUNTER — Ambulatory Visit: Payer: Self-pay | Admitting: Nurse Practitioner

## 2022-08-30 ENCOUNTER — Ambulatory Visit (INDEPENDENT_AMBULATORY_CARE_PROVIDER_SITE_OTHER): Payer: 59 | Admitting: Nurse Practitioner

## 2022-08-30 ENCOUNTER — Encounter: Payer: Self-pay | Admitting: Nurse Practitioner

## 2022-08-30 VITALS — BP 132/82 | HR 76 | Ht 62.5 in | Wt 221.2 lb

## 2022-08-30 DIAGNOSIS — N951 Menopausal and female climacteric states: Secondary | ICD-10-CM | POA: Diagnosis not present

## 2022-08-30 DIAGNOSIS — M25551 Pain in right hip: Secondary | ICD-10-CM

## 2022-08-30 DIAGNOSIS — E1165 Type 2 diabetes mellitus with hyperglycemia: Secondary | ICD-10-CM

## 2022-08-30 DIAGNOSIS — E668 Other obesity: Secondary | ICD-10-CM | POA: Diagnosis not present

## 2022-08-30 MED ORDER — OZEMPIC (0.25 OR 0.5 MG/DOSE) 2 MG/3ML ~~LOC~~ SOPN: 0.5000 mg | PEN_INJECTOR | SUBCUTANEOUS | 0 refills | Status: DC

## 2022-08-30 NOTE — Progress Notes (Signed)
Tollie Eth, DNP, AGNP-c [facility] [address] [place], [place] [address] Main Office [phone]   New patient visit   Patient: Katrina Howe   DOB: 06-11-1970   52 y.o. Female  MRN: [id number] Visit Date: 08/30/2022  Patient Care Team: Tollie Eth, NP as PCP - General (Nurse Practitioner)  Today's Vitals   08/30/22 1525  BP: 132/82  Pulse: 76  SpO2: 98%  Weight: 221 lb 3.2 oz (100.3 kg)  Height: 5' 2.5" (1.588 m)   Body mass index is 39.81 kg/m.   Today's healthcare provider: Tollie Eth, NP   Chief Complaint  Patient presents with   other    New pt. Est. And diabetes, weight loss, rt. Hip pain that radiates down legs, drinks a lot of soda daily does not drink water,    Subjective    Katrina Howe is a 52 y.o. female who presents today as a new patient to establish care.  Katrina Howe expresses concerns today with tiredness and possible menopausal symptoms like hot flashes and sweating. She feels like this have been worse over the last 2-3 days. She has a history of uterine ablation and had cessation of menses until she received her COVID vaccines. Since that time she has had some menstrual bleeding. She tells me she has been advised by Dr. Rana Snare with OB GYN to undergo a hysterectomy due to fibroids, rectocele, and other issues.   Katrina Howe also expresses feelings of depressed moods intermittently, which she feels relates to perimenopause and her weight.    She reports a diagnosis of diabetes. She tells me that she tends to drink a large amount of soda and very little water intake. She has also experienced weight gain, which is concerning for her. She discusses the challenges of managing her weight with her diabetes diagnosis.   Katrina Howe also reports concerns with leg cramping on a regular basis.   Katrina Howe reports discomfort of the right hip and buttocks. She is hoping that weight loss will help with this.     History  reviewed and reveals the following: Past Medical History:  Diagnosis Date   Chest pain 12/18/2013   Chronic cough    Cough 11/24/2017   DOE (dyspnea on exertion) 11/24/2017   Glucose intolerance    Leg edema    Morbid obesity (HCC)    Swelling 11/24/2017   No past surgical history on file. Family Status  Relation Name Status   Mother  Alive   Father  Other       Unknown   MGM  Alive   Son  Alive   Family History  Problem Relation Age of Onset   Heart disease Mother    Heart failure Maternal Grandmother    Heart disease Son        Left ventricular non-compaction   Social History   Socioeconomic History   Marital status: Single    Spouse name: Not on file   Number of children: Not on file   Years of education: Not on file   Highest education level: Not on file  Occupational History    Employer: [employer]  Tobacco Use   Smoking status: Never   Smokeless tobacco: Never  Substance and Sexual Activity   Alcohol use: Yes    Comment: occasional   Drug use: Never   Sexual activity: Yes    Partners: Male  Other Topics Concern   Not on file  Social History Narrative  She is a Lawyer - works 2 jobs.   No time to exercise.   Does not smoke.  Drinks alcohol socially.   Social Determinants of Health   Financial Resource Strain: Not on file  Food Insecurity: Not on file  Transportation Needs: Not on file  Physical Activity: Not on file  Stress: Not on file  Social Connections: Not on file   Outpatient Medications Prior to Visit  Medication Sig   [DISCONTINUED] celecoxib (CELEBREX) 200 MG capsule celecoxib 200 mg capsule (Patient not taking: Reported on 08/30/2022)   [DISCONTINUED] ergocalciferol (VITAMIN D2) 1.25 MG (50000 UT) capsule ergocalciferol (vitamin D2) 1,250 mcg (50,000 unit) capsule (Patient not taking: Reported on 08/30/2022)   [DISCONTINUED] fluconazole (DIFLUCAN) 150 MG tablet Take 150 mg by mouth 2 (two) times a week. (Patient not taking:  Reported on 08/30/2022)   [DISCONTINUED] hydrochlorothiazide (HYDRODIURIL) 25 MG tablet hydrochlorothiazide 25 mg tablet (Patient not taking: Reported on 08/30/2022)   [DISCONTINUED] metFORMIN (GLUCOPHAGE) 500 MG tablet metformin 500 mg tablet  TK 1 T PO BID (Patient not taking: Reported on 08/30/2022)   [DISCONTINUED] pantoprazole (PROTONIX) 40 MG tablet pantoprazole 40 mg tablet,delayed release  TAKE 1 TABLET BY MOUTH EVERY DAY (Patient not taking: Reported on 08/30/2022)   [DISCONTINUED] traMADol (ULTRAM) 50 MG tablet tramadol 50 mg tablet  TAKE 1 TABLET BY MOUTH EVERY 6 TO 8 HOURS (Patient not taking: Reported on 08/30/2022)   No facility-administered medications prior to visit.   Allergies  Allergen Reactions   Iohexol Hives and Swelling     Code: HIVES, Desc: Pt.had omnipaque 300% IV injection (rt.antecubital site) around 12:35pm for CT CHEST,ABDOMEN,PELVIS scan. Left side of upper lip started to swell immediately after scan. Dr. Britta Mccreedy had prescibed and dosed pt. with 50mg . benadryl p.o. around 13, Onset Date: [id number]     There is no immunization history on file for this patient.  Health Maintenance Due Health Maintenance Topics with due status: Overdue     Topic Date Due   COVID-19 Vaccine Never done   FOOT EXAM Never done   OPHTHALMOLOGY EXAM Never done   HIV Screening Never done   Diabetic kidney evaluation - Urine ACR Never done   Hepatitis C Screening Never done   DTaP/Tdap/Td Never done   PAP SMEAR-Modifier Never done   Colonoscopy Never done   MAMMOGRAM Never done   Zoster Vaccines- Shingrix Never done    Review of Systems All review of systems negative except what is listed in the HPI   Objective    BP 132/82   Pulse 76   Ht 5' 2.5" (1.588 m)   Wt 221 lb 3.2 oz (100.3 kg)   SpO2 98%   BMI 39.81 kg/m  Physical Exam Vitals and nursing note reviewed.  Constitutional:      General: She is not in acute distress.    Appearance: Normal appearance. She  is well-developed. She is obese.  HENT:     Head: Normocephalic and atraumatic.  Eyes:     General: No scleral icterus.    Conjunctiva/sclera: Conjunctivae normal.     Pupils: Pupils are equal, round, and reactive to light.  Neck:     Thyroid: No thyromegaly.     Vascular: No carotid bruit.  Cardiovascular:     Rate and Rhythm: Normal rate and regular rhythm.     Pulses: Normal pulses.     Heart sounds: Normal heart sounds. No murmur heard. Pulmonary:     Effort: Pulmonary effort is  normal. No respiratory distress.     Breath sounds: Normal breath sounds. No wheezing, rhonchi or rales.  Abdominal:     General: Bowel sounds are normal. There is no distension.     Palpations: Abdomen is soft.     Tenderness: There is no abdominal tenderness. There is no right CVA tenderness, left CVA tenderness or guarding.  Musculoskeletal:        General: Tenderness present.     Cervical back: Neck supple.     Right lower leg: No edema.     Left lower leg: No edema.     Comments: Right trochanter tenderness noted with additional tenderness with deep palpation to the piriformis.   Lymphadenopathy:     Cervical: No cervical adenopathy.  Skin:    General: Skin is warm and dry.     Capillary Refill: Capillary refill takes less than 2 seconds.     Findings: No rash.  Neurological:     Mental Status: She is alert and oriented to person, place, and time. Mental status is at baseline.  Psychiatric:        Mood and Affect: Mood normal.        Behavior: Behavior normal.   Treatment with  No results found for any visits on 08/30/22.  Assessment & Plan      Problem List Items Addressed This Visit     Moderate obesity    Concern with weight management in the setting of diabetes.  Discussion today on management of diet and exercise as well as utilization of medications to help with management. Plan: -Plan to start Ozempic today for management of both diabetes and weight. -Freestyle libre monitor  placed today to help with monitoring of blood sugar levels to assist in diabetes and weight control -Dietary habits and portion control discussed. -Information provided on meal planning.       Relevant Medications   Semaglutide,0.25 or 0.5MG /DOS, (OZEMPIC, 0.25 OR 0.5 MG/DOSE,) 2 MG/3ML SOPN   Type 2 diabetes mellitus with hyperglycemia (HCC) - Primary    Diagnosis of diabetes, not currently on medication therapy. Historically she has been on metformin, but was unable to tolerate the medication and has therefore stopped. Extensive discussion today on the pathophysiology and treatment of this condition including diet and exercise, as well as medications. At this time, there is concern with the amount of soda and lack of water that she is consuming contributing to her symptoms and her weight.  Plan: - Initiate empiric for diabetes management. -Encourage reduction of soda intake by 1/day each week and a strong focus on portion control -Advised on the importance of hydration and suggest alternatives like bubbly water or adding Crystal light packs to water to help increase intake -Freestyle libre continuous glucose monitor applied to help patient monitor blood sugar levels and understand the impact of dietary choices on blood sugar -Education provided on the mechanism of diabetes and the role of medication in managing hunger signals and blood sugar levels.      Relevant Medications   Semaglutide,0.25 or 0.5MG /DOS, (OZEMPIC, 0.25 OR 0.5 MG/DOSE,) 2 MG/3ML SOPN   Perimenopause    Reported symptoms of hot flashes, night sweats, and mood changes that may indicate hormonal fluctuations associated with perimenopause. Menses are not regular due to history of ablation. She has additional diagnoses of fibroids and rectocele with recommendation for hysterectomy.  Plan: - Recommend discussion with GYN for evaluation of symptom management and recommendations       Pain of right  hip    Right hip pain  consistent with trochanteric bursitis.  The pain is exacerbated by laying on the right side.  She also has some symptoms consistent with piriformis syndrome. Plan: -Stretches provided -Consider using tennis ball for self massage to help alleviate symptoms -Weight loss management initiated today to help potentially reduce current symptoms -Will consider orthopedic management if symptoms worsen or fail to improve with treatment.        Return in about 4 weeks (around 09/27/2022) for virtual med check.    Time: 71 minutes, >50% spent counseling, care coordination, chart review, and documentation.    Marcelino Campos, Sung Amabile, NP, DNP, AGNP-C [facility]

## 2022-08-30 NOTE — Patient Instructions (Addendum)
Lets get you started on Ozempic.   2 hours after you have eaten you want your blood sugar less than 150.  When you wake up you want your blood sugar to be between 80-120.   Your goal over the next month: decrease your soda intake by 1 soda/day a week.   DIABETES High blood sugar can damage your organs, blood vessels, and nerves, slow wound healing, and increase your risk of infection, among many other things.  The risk of having a heart attack and/or stroke is [facility] higher if you have diabetes with uncontrolled blood sugars.  To help reduce your risks and keep you healthy, we must work together to get your blood sugar levels under control with diet, exercise, and medication.    The most important and effective way to control diabetes are through diet changes and regular exercise.   What is Happening With Diabetes? Foods high in carbohydrates (sugars, starches, bread, pasta, potatoes, soda, fruit juices, etc) break down into a sugar called glucose once in your body. Glucose is used by the cells in your body for fuel to have the energy they need to work properly.  As the glucose is released into your blood stream during digestion, your blood sugar goes up. This is called hyperglycemia.   Insulin is a hormone in the body that works as a key to unlock the cell and allow the glucose in.  Normally, insulin is released in response to rising blood glucose levels.   In people with diabetes, either the cells have changed the locks and don't open with the insulin key or there is not enough insulin made by the body to use all of the glucose in the blood.   This means that the glucose never makes it into the cells for fuel and stays in the blood. The high levels of glucose in the blood are like a poison to your organs and blood vessels and over time permanent damage starts to occur.  What Kind of Diet is Best for Diabetes? A person with diabetes must limit the amount of carbohydrates and sugar eaten to  help prevent high blood glucose levels.     You should aim for less than 1/2 of your total calorie intake per day to come from carbohydrates.  What this means is, if you eat a 1200-1500 calorie diet, you will want less than 600-750 of those calories to come from carbohydrates. That equals to about 150-200 grams of carbohydrates per day.   GOAL: Eat 1200-1500 calories a day with 150-180 grams of carbohydrates.   Reading labels is very important to help understand how many carbohydrates are in certain foods. There are also tables available online for restaurant foods that may help when you are eating out.   Important foods to INCREASE in your diet are lean meats, protein, and vegetables. It can be very helpful to measure the food you are eating by the recommended serving size on packages to make sure you are not overeating and monitor your calories and carbohydrates.   How Does Exercise Help with Diabetes? When you exercise, the cells in your body use up more energy. This means that they need more fuel to keep going. The cells that can still use the insulin key, take in more of the glucose from the blood for energy and the blood glucose levels go down.   Excess fat cells can be the cause of the insulin key no longer working. Exercise and weight loss can change the  locks back to allow the insulin key to work again.   Enough weight loss can sometimes get rid of diabetes!  What Kind of Exercise is Best for Diabetes? I recommend starting out with moderate exercise, like walking.  Walking every single day for at least 15-20 minutes can be enough to get you up and moving without wearing you out.  You want to walk at a pace that you can carry on a conversation without being too out of breath, but that you also get your heart rate up and break a sweat.  As you get used to daily walking, you should increase how far, how fast, and how long you walk.    Monitoring your blood sugars helps you have an  understanding of how your diet and activity levels are affecting your blood sugar. Certain foods that you think may not increase your blood sugar really make a difference. By monitoring your sugar when you eat, you can see how different foods affect your numbers. This is very important when you first start treatment to get a good understanding of what foods are good and what foods you should limit.   I would like you to monitor your blood sugar every morning before eating and write down the number to bring with you to your next visit. This will help [place] determine if we need to make changes to the medication and diet.   You may also want to check your blood sugar after meals to see how certain foods are affecting the numbers. Excellent blood sugar goals are between 90-120 when you have not eaten and less than 160 1-2 hours after a meal. If you are checking your blood sugars 1-2 hours after a meal and they are higher than this, this tells [place] that we need better control. If you are waking up with blood sugars above 120, we need to look at your diet the day before to see if this could be affecting the numbers.   Medication is the key to help control your blood sugars. With diabetes, your body is not using insulin like it should to help the glucose (sugar) get into the cells for fuel. Medications help your body produce more insulin and make your cells more receptive to insulin so that the glucose in your body can be used instead of remaining in the blood.  The first line medication is called Metformin. This is a pill that you take daily, usually twice a day, to help your body properly use glucose. If good control is not achieved with metformin, other medications can be added or changed to help with better control.   Management of cholesterol is vital to reduce your risks of heart attack and stroke. Medication to control your cholesterol is also very important and necessary for your overall health.  As a new  diabetic, we will plan to follow-up every 3 months to check your hemoglobin A1c, which gives me an average of your blood sugars over the past 3 months to help determine your control. Once your blood sugars are well controlled, we can go down to checking every 6 months.  We also need to closely monitor your feet, kidney function, and cholesterol as these are all affected from diabetes.   WEIGHT LOSS PLANNING Your progress today shows:     08/30/2022    3:25 PM 04/20/2019    8:20 AM 11/22/2017    2:06 PM  Vitals with BMI  Height 5' 2.5"    Weight 221  lbs 3 oz    BMI 39.79    Systolic 132 140 782  Diastolic 82 67 83  Pulse 76 73 75    For best management of weight, it is vital to balance intake versus output. This means the number of calories burned per day must be less than the calories you take in with food and drink.   I recommend trying to follow a diet with the following: Calories: 1200-1500 calories per day Carbohydrates: 150-180 grams of carbohydrates per day  Why: Gives your body enough "quick fuel" for cells to maintain normal function without sending them into starvation mode.  Protein: At least 90 grams of protein per day- 30 grams with each meal Why: Protein takes longer and uses more energy than carbohydrates to break down for fuel. The carbohydrates in your meals serves as quick energy sources and proteins help use some of that extra quick energy to break down to produce long term energy. This helps you not feel hungry as quickly and protein breakdown burns calories.  Water: Drink AT LEAST 64 ounces of water per day  Why: Water is essential to healthy metabolism. Water helps to fill the stomach and keep you fuller longer. Water is required for healthy digestion and filtering of waste in the body.  Fat: Limit fats in your diet- when choosing fats, choose foods with lower fats content such as lean meats (chicken, fish, [place]).  Why: Increased fat intake leads to storage "for  later". Once you burn your carbohydrate energy, your body goes into fat and protein breakdown mode to help you loose weight.  Cholesterol: Fats and oils that are LIQUID at room temperature are best. Choose vegetable oils (olive oil, avocado oil, nuts). Avoid fats that are SOLID at room temperature (animal fats, processed meats). Healthy fats are often found in whole grains, beans, nuts, seeds, and berries.  Why: Elevated cholesterol levels lead to build up of cholesterol on the inside of your blood vessels. This will eventually cause the blood vessels to become hard and can lead to high blood pressure and damage to your organs. When the blood flow is reduced, but the pressure is high from cholesterol buildup, parts of the cholesterol can break off and form clots that can go to the brain or heart leading to a stroke or heart attack.  Fiber: Increase amount of SOLUBLE the fiber in your diet. This helps to fill you up, lowers cholesterol, and helps with digestion. Some foods high in soluble fiber are oats, peas, beans, apples, carrots, barley, and citrus fruits.   Why: Fiber fills you up, helps remove excess cholesterol, and aids in healthy digestion which are all very important in weight management.   I recommend the following as a minimum activity routine: Purposeful walk or other physical activity at least 20 minutes every single day. This means purposefully taking a walk, jog, bike, swim, treadmill, elliptical, dance, etc.  This activity should be ABOVE your normal daily activities, such as walking at work. Goal exercise should be at least 150 minutes a week- work your way up to this.   Heart Rate: Your maximum exercise heart rate should be 220 - Your Age in Years. When exercising, get your heart rate up, but avoid going over the maximum targeted heart rate.  60-70% of your maximum heart rate is where you tend to burn the most fat. To find this number:  220 - Age In Years= Max HR  Max HR x 0.6 (or  0.7) =  Fat Burning HR The Fat Burning HR is your goal heart rate while working out to burn the most fat.  NEVER exercise to the point your feel lightheaded, weak, nauseated, dizzy. If you experience ANY of these symptoms- STOP exercise! Allow yourself to cool down and your heart rate to come down. Then restart slower next time.  If at ANY TIME you feel chest pain or chest pressure during exercise, STOP IMMEDIATELY and seek medical attention.

## 2022-09-04 ENCOUNTER — Telehealth: Payer: Self-pay | Admitting: Nurse Practitioner

## 2022-09-04 NOTE — Telephone Encounter (Signed)
Katrina Howe (Key: [id number]) Rx #: [id number] Ozempic (0.25 or 0.5 MG/DOSE) 2MG /3ML pen-injectors

## 2022-09-18 ENCOUNTER — Encounter: Payer: Self-pay | Admitting: Nurse Practitioner

## 2022-09-18 DIAGNOSIS — E1165 Type 2 diabetes mellitus with hyperglycemia: Secondary | ICD-10-CM | POA: Insufficient documentation

## 2022-09-18 DIAGNOSIS — M25551 Pain in right hip: Secondary | ICD-10-CM | POA: Insufficient documentation

## 2022-09-18 DIAGNOSIS — N951 Menopausal and female climacteric states: Secondary | ICD-10-CM | POA: Insufficient documentation

## 2022-09-18 NOTE — Assessment & Plan Note (Signed)
Diagnosis of diabetes, not currently on medication therapy. Historically she has been on metformin, but was unable to tolerate the medication and has therefore stopped. Extensive discussion today on the pathophysiology and treatment of this condition including diet and exercise, as well as medications. At this time, there is concern with the amount of soda and lack of water that she is consuming contributing to her symptoms and her weight.  Plan: - Initiate empiric for diabetes management. -Encourage reduction of soda intake by 1/day each week and a strong focus on portion control -Advised on the importance of hydration and suggest alternatives like bubbly water or adding Crystal light packs to water to help increase intake -Freestyle libre continuous glucose monitor applied to help patient monitor blood sugar levels and understand the impact of dietary choices on blood sugar -Education provided on the mechanism of diabetes and the role of medication in managing hunger signals and blood sugar levels.

## 2022-09-18 NOTE — Assessment & Plan Note (Signed)
Right hip pain consistent with trochanteric bursitis.  The pain is exacerbated by laying on the right side.  She also has some symptoms consistent with piriformis syndrome. Plan: -Stretches provided -Consider using tennis ball for self massage to help alleviate symptoms -Weight loss management initiated today to help potentially reduce current symptoms -Will consider orthopedic management if symptoms worsen or fail to improve with treatment.

## 2022-09-18 NOTE — Assessment & Plan Note (Signed)
Concern with weight management in the setting of diabetes.  Discussion today on management of diet and exercise as well as utilization of medications to help with management. Plan: -Plan to start Ozempic today for management of both diabetes and weight. -Freestyle libre monitor placed today to help with monitoring of blood sugar levels to assist in diabetes and weight control -Dietary habits and portion control discussed. -Information provided on meal planning.

## 2022-09-18 NOTE — Assessment & Plan Note (Signed)
Reported symptoms of hot flashes, night sweats, and mood changes that may indicate hormonal fluctuations associated with perimenopause. Menses are not regular due to history of ablation. She has additional diagnoses of fibroids and rectocele with recommendation for hysterectomy.  Plan: - Recommend discussion with GYN for evaluation of symptom management and recommendations

## 2022-10-08 NOTE — Telephone Encounter (Signed)
No response from P.A.  Resubmitted with chart notes & was approved til 10/08/23, printed discount card, called pharmacy went thru for $0 co pay.  Called pt & informed, she states she thinks this is causing leg cramps but she will talk with Huntley Dec at her follow up appt about this.

## 2022-10-12 ENCOUNTER — Ambulatory Visit: Payer: 59 | Admitting: Nurse Practitioner

## 2022-10-26 ENCOUNTER — Ambulatory Visit: Payer: 59 | Admitting: Nurse Practitioner

## 2022-10-26 NOTE — Progress Notes (Deleted)
  Shawna Clamp, DNP, AGNP-c [facility]  [address] [place], [place] [address] [phone]  ESTABLISHED PATIENT- Chronic Health and/or Follow-Up Visit  There were no vitals taken for this visit.    Katrina Howe is a 52 y.o. year old female presenting today for evaluation and management of chronic conditions.   ***  All ROS negative with exception of what is listed above.   PHYSICAL EXAM Physical Exam  PLAN Problem List Items Addressed This Visit   None   No follow-ups on file.  Time: *** minutes, >50% spent counseling, care coordination, chart review, and documentation.   Shawna Clamp, DNP, AGNP-c

## 2023-01-17 ENCOUNTER — Other Ambulatory Visit (HOSPITAL_BASED_OUTPATIENT_CLINIC_OR_DEPARTMENT_OTHER): Payer: Self-pay

## 2023-01-17 MED ORDER — WEGOVY 0.25 MG/0.5ML ~~LOC~~ SOAJ
0.2500 mg | SUBCUTANEOUS | 1 refills | Status: DC
  Filled 2023-01-17: qty 2, 28d supply, fill #0

## 2023-01-21 ENCOUNTER — Other Ambulatory Visit (HOSPITAL_BASED_OUTPATIENT_CLINIC_OR_DEPARTMENT_OTHER): Payer: Self-pay

## 2023-01-23 ENCOUNTER — Other Ambulatory Visit (HOSPITAL_BASED_OUTPATIENT_CLINIC_OR_DEPARTMENT_OTHER): Payer: Self-pay

## 2023-01-25 ENCOUNTER — Other Ambulatory Visit: Payer: Self-pay | Admitting: Nurse Practitioner

## 2023-01-25 DIAGNOSIS — Z1212 Encounter for screening for malignant neoplasm of rectum: Secondary | ICD-10-CM

## 2023-01-25 DIAGNOSIS — Z1211 Encounter for screening for malignant neoplasm of colon: Secondary | ICD-10-CM

## 2023-01-31 ENCOUNTER — Encounter: Payer: Self-pay | Admitting: Nurse Practitioner

## 2023-01-31 DIAGNOSIS — E1165 Type 2 diabetes mellitus with hyperglycemia: Secondary | ICD-10-CM

## 2023-02-01 ENCOUNTER — Telehealth: Payer: Self-pay

## 2023-02-01 ENCOUNTER — Other Ambulatory Visit: Payer: Self-pay

## 2023-02-01 MED ORDER — TIRZEPATIDE 2.5 MG/0.5ML ~~LOC~~ SOAJ: 2.5000 mg | SUBCUTANEOUS | 0 refills | Status: DC

## 2023-02-01 NOTE — Telephone Encounter (Signed)
Key: [id number] PA Case ID #: [id number] Rx #: [id number] Status: sent iconSent to Plan today Drug: Mounjaro 2.5MG /0.5ML auto-injectors Form: [employer] Electronic Prior Authorization Form (2017 NCPDP)

## 2023-02-01 NOTE — Telephone Encounter (Signed)
I have sent this in for her.   A1c 6.6. Tried and failed Ozempic due to joint pain.

## 2023-02-04 NOTE — Telephone Encounter (Signed)
Pt notified. Approval faxed to pharmacy.

## 2023-02-04 NOTE — Telephone Encounter (Signed)
Key: [id number] PA Case ID #: [id number] Rx #: [id number] Outcome: Approved on October 11 by [employer] 2017 NCPDP Request Reference Number: [id number].   MOUNJARO INJ 2.5/0.5 is approved through 02/01/2024. Your patient may now fill this prescription and it will be covered.  Authorization Expiration Date: 02/01/2024 Drug: Greggory Keen 2.5MG /0.5ML auto-injectors Form: [employer] Electronic Prior Authorization Form [phone] NCPDP)

## 2023-03-05 ENCOUNTER — Other Ambulatory Visit: Payer: Self-pay | Admitting: Nurse Practitioner

## 2023-03-05 DIAGNOSIS — E1165 Type 2 diabetes mellitus with hyperglycemia: Secondary | ICD-10-CM

## 2023-04-10 ENCOUNTER — Telehealth: Payer: Self-pay

## 2023-04-10 NOTE — Telephone Encounter (Signed)
Pt. Called needs a refill on her mounjaro but said the dosage probably needs to be increased now. She wanted to come in for an appointment before her next scheduled injection which would be 04/17/23 I got her down for an appointment but not until 04/25/23 your first opening. She wanted to know if you could call in her mounjaro but at the next increased dose before her appointment because she is out of medicine now.

## 2023-04-11 ENCOUNTER — Other Ambulatory Visit: Payer: Self-pay

## 2023-04-11 MED ORDER — TIRZEPATIDE 5 MG/0.5ML ~~LOC~~ SOAJ: 5.0000 mg | SUBCUTANEOUS | 0 refills | Status: DC

## 2023-04-12 NOTE — Telephone Encounter (Signed)
Ok to send

## 2023-04-25 ENCOUNTER — Ambulatory Visit (INDEPENDENT_AMBULATORY_CARE_PROVIDER_SITE_OTHER): Payer: 59 | Admitting: Nurse Practitioner

## 2023-04-25 ENCOUNTER — Encounter: Payer: Self-pay | Admitting: Nurse Practitioner

## 2023-04-25 ENCOUNTER — Other Ambulatory Visit (HOSPITAL_COMMUNITY): Payer: Self-pay

## 2023-04-25 VITALS — BP 124/82 | HR 70 | Wt 200.2 lb

## 2023-04-25 DIAGNOSIS — E1165 Type 2 diabetes mellitus with hyperglycemia: Secondary | ICD-10-CM

## 2023-04-25 DIAGNOSIS — Z1211 Encounter for screening for malignant neoplasm of colon: Secondary | ICD-10-CM

## 2023-04-25 DIAGNOSIS — R21 Rash and other nonspecific skin eruption: Secondary | ICD-10-CM | POA: Diagnosis not present

## 2023-04-25 DIAGNOSIS — E669 Obesity, unspecified: Secondary | ICD-10-CM | POA: Diagnosis not present

## 2023-04-25 MED ORDER — MOMETASONE FUROATE 0.1 % EX CREA
TOPICAL_CREAM | CUTANEOUS | 1 refills | Status: AC
  Filled 2023-04-25: qty 45, 30d supply, fill #0

## 2023-04-25 MED ORDER — TIRZEPATIDE 5 MG/0.5ML ~~LOC~~ SOAJ
5.0000 mg | SUBCUTANEOUS | 2 refills | Status: DC
  Filled 2023-04-25: qty 2, 28d supply, fill #0
  Filled 2023-05-16: qty 2, 28d supply, fill #1

## 2023-04-25 NOTE — Progress Notes (Signed)
  Katrina Doing, DNP, AGNP-c [facility]  [address] [place], [place] [address] [phone]  ESTABLISHED PATIENT- Chronic Health and/or Follow-Up Visit  Blood pressure 124/82, pulse 70, weight 200 lb 3.2 oz (90.8 kg).    Katrina Howe is a 53 y.o. year old female presenting today for evaluation and management of chronic conditions.   Katrina Howe has been working diligently on weight loss through diet and exercise for management of her diabetes.  She is currently on Mounjaro  but has been out of the medication for about 2 weeks due to insurance issues.  She is down 21 pounds from the time she started the medication.  She enjoys exercising while she works on her hobbies. She does report that occasionally after injecting her medication she will have a small pruritic nonraised area at the injection site.  This typically resolves in 1 to 2 days  All ROS negative with exception of what is listed above.   PHYSICAL EXAM Physical Exam Vitals and nursing note reviewed.  Constitutional:      Appearance: Normal appearance.  HENT:     Head: Normocephalic.  Eyes:     Pupils: Pupils are equal, round, and reactive to light.  Cardiovascular:     Rate and Rhythm: Normal rate and regular rhythm.     Pulses: Normal pulses.     Heart sounds: Normal heart sounds.  Pulmonary:     Effort: Pulmonary effort is normal.     Breath sounds: Normal breath sounds.  Musculoskeletal:        General: Normal range of motion.     Cervical back: Normal range of motion.  Skin:    General: Skin is warm.  Neurological:     General: No focal deficit present.     Mental Status: She is alert and oriented to person, place, and time.  Psychiatric:        Mood and Affect: Mood normal.      PLAN Problem List Items Addressed This Visit     Type 2 diabetes mellitus with hyperglycemia (HCC) - Primary   Chronic type 2 diabetes currently managed with Mounjaro .  She is tolerating the  medication well.  She is working on diet and exercise.  Currently she is down 21 pounds.  We will monitor A1c today.  Refills provided on Mounjaro  at 5 mg.  Will plan to increase up to 7.5 mg in 4 weeks if tolerated otherwise we will plan to continue on the current dose.      Relevant Medications   tirzepatide  (MOUNJARO ) 5 MG/0.5ML Pen   Other Relevant Orders   Hemoglobin A1c (Completed)   CBC with Differential/Platelet (Completed)   Comprehensive metabolic panel (Completed)   VITAMIN D  25 Hydroxy (Vit-D Deficiency, Fractures) (Completed)   Microalbumin / creatinine urine ratio   Moderate obesity   Relevant Medications   tirzepatide  (MOUNJARO ) 5 MG/0.5ML Pen   Other Relevant Orders   Hemoglobin A1c (Completed)   CBC with Differential/Platelet (Completed)   Comprehensive metabolic panel (Completed)   VITAMIN D  25 Hydroxy (Vit-D Deficiency, Fractures) (Completed)   Other Visit Diagnoses       Rash at application site       Relevant Medications   mometasone  (ELOCON ) 0.1 % cream     Screening for colon cancer       Relevant Orders   Cologuard       Return in about 6 months (around 10/23/2023) for CPE.  Katrina Doing, DNP, AGNP-c

## 2023-04-25 NOTE — Patient Instructions (Addendum)
 Try magnesium at bedtime 200-400mg  to see if this helps with the leg cramps and with using the bathroom.   You are doing AMAZING!!!! I am so proud of you!!!  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  I sent your Mounjaro  to the [facility]- they are more likely to have the medication.  This is [address]  DRINK MORE WATER!!! :-)

## 2023-04-26 LAB — CBC WITH DIFFERENTIAL/PLATELET
Basophils Absolute: 0.1 10*3/uL (ref 0.0–0.2)
Basos: 1 %
EOS (ABSOLUTE): 0.2 10*3/uL (ref 0.0–0.4)
Eos: 3 %
Hematocrit: 41 % (ref 34.0–46.6)
Hemoglobin: 12.5 g/dL (ref 11.1–15.9)
Immature Grans (Abs): 0 10*3/uL (ref 0.0–0.1)
Immature Granulocytes: 0 %
Lymphocytes Absolute: 2.7 10*3/uL (ref 0.7–3.1)
Lymphs: 42 %
MCH: 25.9 pg — ABNORMAL LOW (ref 26.6–33.0)
MCHC: 30.5 g/dL — ABNORMAL LOW (ref 31.5–35.7)
MCV: 85 fL (ref 79–97)
Monocytes Absolute: 0.6 10*3/uL (ref 0.1–0.9)
Monocytes: 9 %
Neutrophils Absolute: 3 10*3/uL (ref 1.4–7.0)
Neutrophils: 45 %
Platelets: 459 10*3/uL — ABNORMAL HIGH (ref 150–450)
RBC: 4.82 x10E6/uL (ref 3.77–5.28)
RDW: 14.5 % (ref 11.7–15.4)
WBC: 6.6 10*3/uL (ref 3.4–10.8)

## 2023-04-26 LAB — COMPREHENSIVE METABOLIC PANEL
ALT: 15 [IU]/L (ref 0–32)
AST: 16 [IU]/L (ref 0–40)
Albumin: 4.4 g/dL (ref 3.8–4.9)
Alkaline Phosphatase: 96 [IU]/L (ref 44–121)
BUN/Creatinine Ratio: 8 — ABNORMAL LOW (ref 9–23)
BUN: 7 mg/dL (ref 6–24)
Bilirubin Total: 0.3 mg/dL (ref 0.0–1.2)
CO2: 23 mmol/L (ref 20–29)
Calcium: 9.8 mg/dL (ref 8.7–10.2)
Chloride: 105 mmol/L (ref 96–106)
Creatinine, Ser: 0.83 mg/dL (ref 0.57–1.00)
Globulin, Total: 3.1 g/dL (ref 1.5–4.5)
Glucose: 99 mg/dL (ref 70–99)
Potassium: 4.4 mmol/L (ref 3.5–5.2)
Sodium: 143 mmol/L (ref 134–144)
Total Protein: 7.5 g/dL (ref 6.0–8.5)
eGFR: 84 mL/min/{1.73_m2} (ref >59)

## 2023-04-26 LAB — VITAMIN D 25 HYDROXY (VIT D DEFICIENCY, FRACTURES): Vit D, 25-Hydroxy: 12.8 ng/mL — ABNORMAL LOW (ref 30.0–100.0)

## 2023-04-26 LAB — HEMOGLOBIN A1C
Est. average glucose Bld gHb Est-mCnc: 134 mg/dL
Hgb A1c MFr Bld: 6.3 % — ABNORMAL HIGH (ref 4.8–5.6)

## 2023-04-30 NOTE — Assessment & Plan Note (Signed)
 Chronic type 2 diabetes currently managed with Mounjaro .  She is tolerating the medication well.  She is working on diet and exercise.  Currently she is down 21 pounds.  We will monitor A1c today.  Refills provided on Mounjaro  at 5 mg.  Will plan to increase up to 7.5 mg in 4 weeks if tolerated otherwise we will plan to continue on the current dose.

## 2023-05-16 ENCOUNTER — Other Ambulatory Visit (HOSPITAL_COMMUNITY): Payer: Self-pay

## 2023-06-12 ENCOUNTER — Telehealth: Payer: Self-pay | Admitting: Nurse Practitioner

## 2023-06-12 ENCOUNTER — Other Ambulatory Visit (HOSPITAL_COMMUNITY): Payer: Self-pay

## 2023-06-12 DIAGNOSIS — E669 Obesity, unspecified: Secondary | ICD-10-CM

## 2023-06-12 DIAGNOSIS — E1165 Type 2 diabetes mellitus with hyperglycemia: Secondary | ICD-10-CM

## 2023-06-12 NOTE — Telephone Encounter (Signed)
Copied from CRM [id number]. Topic: Clinical - Prescription Issue >> Jun 12, 2023 10:13 AM Marlow Baars wrote: Reason for CRM: The patient called in specifically requesting a dosage change on her Mounjaro. She said she is due for the  .75 or the next dosage up from the 0.5. She uses   [facility]  Phone: [phone] Fax: [phone]   Please assist patient further

## 2023-06-13 ENCOUNTER — Other Ambulatory Visit (HOSPITAL_COMMUNITY): Payer: Self-pay

## 2023-06-13 ENCOUNTER — Other Ambulatory Visit: Payer: Self-pay

## 2023-06-13 MED ORDER — TIRZEPATIDE 7.5 MG/0.5ML ~~LOC~~ SOAJ
7.5000 mg | SUBCUTANEOUS | 3 refills | Status: DC
  Filled 2023-06-13 – 2023-06-27 (×3): qty 2, 28d supply, fill #0
  Filled 2023-07-19: qty 2, 28d supply, fill #1
  Filled 2023-08-16: qty 2, 28d supply, fill #2
  Filled 2023-09-25: qty 2, 28d supply, fill #3

## 2023-06-13 NOTE — Addendum Note (Signed)
Addended by: Daziah Hesler, Huntley Dec E on: 06/13/2023 08:16 AM   Modules accepted: Orders

## 2023-06-13 NOTE — Telephone Encounter (Signed)
Mounjaro 7.5mg  sent to [facility].

## 2023-06-25 ENCOUNTER — Other Ambulatory Visit (HOSPITAL_COMMUNITY): Payer: Self-pay

## 2023-06-26 ENCOUNTER — Other Ambulatory Visit (HOSPITAL_COMMUNITY): Payer: Self-pay

## 2023-06-27 ENCOUNTER — Other Ambulatory Visit (HOSPITAL_COMMUNITY): Payer: Self-pay

## 2023-07-03 ENCOUNTER — Other Ambulatory Visit (HOSPITAL_COMMUNITY): Payer: Self-pay

## 2023-09-05 ENCOUNTER — Encounter: Payer: Self-pay | Admitting: Nurse Practitioner

## 2023-09-09 ENCOUNTER — Other Ambulatory Visit (HOSPITAL_COMMUNITY): Payer: Self-pay

## 2023-10-04 ENCOUNTER — Other Ambulatory Visit (HOSPITAL_COMMUNITY): Payer: Self-pay

## 2023-10-24 ENCOUNTER — Other Ambulatory Visit: Payer: Self-pay | Admitting: Nurse Practitioner

## 2023-10-24 DIAGNOSIS — E1165 Type 2 diabetes mellitus with hyperglycemia: Secondary | ICD-10-CM

## 2023-10-24 DIAGNOSIS — E669 Obesity, unspecified: Secondary | ICD-10-CM

## 2023-10-24 MED ORDER — TIRZEPATIDE 7.5 MG/0.5ML ~~LOC~~ SOAJ
7.5000 mg | SUBCUTANEOUS | 3 refills | Status: DC
  Filled 2023-10-24: qty 2, 28d supply, fill #0
  Filled 2023-11-27: qty 2, 28d supply, fill #1

## 2023-10-24 NOTE — Telephone Encounter (Signed)
 Copied from CRM [id number]. Topic: Clinical - Medication Refill >> Oct 24, 2023 12:18 PM Fredrica W wrote: Medication: tirzepatide  (MOUNJARO ) 7.5 MG/0.5ML Pen - been let this dose for 3 months - wanted to know if it should be increased   Has the patient contacted their pharmacy? No (Agent: If no, request that the patient contact the pharmacy for the refill. If patient does not wish to contact the pharmacy document the reason why and proceed with request.) (Agent: If yes, when and what did the pharmacy advise?) no refills   This is the patient's preferred pharmacy:  [facility] [address] [place] [address] Phone: [phone] Fax: [phone]  Is this the correct pharmacy for this prescription? Yes If no, delete pharmacy and type the correct one.   Has the prescription been filled recently? Yes  Is the patient out of the medication? Yes  Has the patient been seen for an appointment in the last year OR does the patient have an upcoming appointment? Yes  Can we respond through MyChart? No  Agent: Please be advised that Rx refills may take up to 3 business days. We ask that you follow-up with your pharmacy.   ----------------------------------------------------------------------- From previous Reason for Contact - Cancel/Reschedule: Patient/patient representative is calling to cancel or reschedule an appointment. Refer to attachments for appointment information.

## 2023-10-24 NOTE — Telephone Encounter (Signed)
 Copied from CRM [id number]. Topic: Appointments - Scheduling Inquiry for Clinic >> Oct 24, 2023 12:20 PM Fredrica W wrote: Reason for CRM: Patient called back to get physical appt rescheduled. She received a call provider would be out of town. Not showing opening until February. Thank You  Left message for pt to call me back so I can reschedule her physical

## 2023-10-26 ENCOUNTER — Other Ambulatory Visit (HOSPITAL_COMMUNITY): Payer: Self-pay

## 2023-10-28 ENCOUNTER — Other Ambulatory Visit (HOSPITAL_COMMUNITY): Payer: Self-pay

## 2023-10-30 ENCOUNTER — Other Ambulatory Visit (HOSPITAL_COMMUNITY): Payer: Self-pay

## 2023-11-07 ENCOUNTER — Encounter: Payer: 59 | Admitting: Nurse Practitioner

## 2023-11-28 ENCOUNTER — Other Ambulatory Visit: Payer: Self-pay

## 2024-01-03 ENCOUNTER — Ambulatory Visit: Admitting: Nurse Practitioner

## 2024-01-03 ENCOUNTER — Encounter: Payer: Self-pay | Admitting: Nurse Practitioner

## 2024-01-03 ENCOUNTER — Other Ambulatory Visit (HOSPITAL_COMMUNITY): Payer: Self-pay

## 2024-01-03 VITALS — BP 124/82 | HR 64 | Ht 62.0 in | Wt 184.0 lb

## 2024-01-03 DIAGNOSIS — E669 Obesity, unspecified: Secondary | ICD-10-CM | POA: Diagnosis not present

## 2024-01-03 DIAGNOSIS — R195 Other fecal abnormalities: Secondary | ICD-10-CM | POA: Diagnosis not present

## 2024-01-03 DIAGNOSIS — R252 Cramp and spasm: Secondary | ICD-10-CM

## 2024-01-03 DIAGNOSIS — N951 Menopausal and female climacteric states: Secondary | ICD-10-CM

## 2024-01-03 DIAGNOSIS — E1165 Type 2 diabetes mellitus with hyperglycemia: Secondary | ICD-10-CM

## 2024-01-03 DIAGNOSIS — Z1211 Encounter for screening for malignant neoplasm of colon: Secondary | ICD-10-CM

## 2024-01-03 DIAGNOSIS — Z0000 Encounter for general adult medical examination without abnormal findings: Secondary | ICD-10-CM | POA: Diagnosis not present

## 2024-01-03 LAB — LIPID PANEL

## 2024-01-03 MED ORDER — TIRZEPATIDE 10 MG/0.5ML ~~LOC~~ SOAJ
10.0000 mg | SUBCUTANEOUS | 2 refills | Status: DC
  Filled 2024-01-03: qty 2, 28d supply, fill #0
  Filled 2024-01-30: qty 2, 28d supply, fill #1
  Filled 2024-02-27: qty 2, 28d supply, fill #2

## 2024-01-03 NOTE — Assessment & Plan Note (Signed)
 Experiencing challenges with weight management, including difficulty drinking water and cravings for sweets. Currently on semaglutide  7.5 mg and tolerating it well. Reports scarring and itching at the injection site. Engaging in exercise and managing diet by eating at specific times and reducing soda intake. - Increase semaglutide  to 10 mg. - Encourage hydration with alternatives like Propel or Bubbly to improve water intake. - Advise on the benefits of exercise and maintaining a balanced diet. - Discuss the importance of gradual weight loss to avoid skin sagging.

## 2024-01-03 NOTE — Patient Instructions (Addendum)
 You are looking great today!!!    DRINK YOUR WATER :-)   For all adult patients, I recommend A well balanced diet low in saturated fats, cholesterol, and moderation in carbohydrates.   This can be as simple as monitoring portion sizes and cutting back on sugary beverages such as soda and juice to start with.    Daily water consumption of at least 64 ounces.  Physical activity at least 180 minutes per week, if just starting out.   This can be as simple as taking the stairs instead of the elevator and walking 2-3 laps around the office  purposefully every day.   STD protection, partner selection, and regular testing if high risk.  Limited consumption of alcoholic beverages if alcohol is consumed.  For women, I recommend no more than 7 alcoholic beverages per week, spread out throughout the week.  Avoid binge drinking or consuming large quantities of alcohol in one setting.   Please let me know if you feel you may need help with reduction or quitting alcohol consumption.   Avoidance of nicotine, if used.  Please let me know if you feel you may need help with reduction or quitting nicotine use.   Daily mental health attention.  This can be in the form of 5 minute daily meditation, prayer, journaling, yoga, reflection, etc.   Purposeful attention to your emotions and mental state can significantly improve your overall wellbeing  and  Health.  Please know that I am here to help you with all of your health care goals and am happy to work with you to find a solution that works best for you.  The greatest advice I have received with any changes in life are to take it one step at a time, that even means if all you can focus on is the next 60 seconds, then do that and celebrate your victories.  With any changes in life, you will have set backs, and that is OK. The important thing to remember is, if you have a set back, it is not a failure, it is an opportunity to try again!  Health Maintenance  Recommendations Screening Testing Mammogram Every 1 -2 years based on history and risk factors Starting at age 46 Pap Smear Ages 21-39 every 3 years Ages 86-65 every 5 years with HPV testing More frequent testing may be required based on results and history Colon Cancer Screening Every 1-10 years based on test performed, risk factors, and history Starting at age 71 Bone Density Screening Every 2-10 years based on history Starting at age 24 for women Recommendations for men differ based on medication usage, history, and risk factors AAA Screening One time ultrasound Men 81-62 years old who have every smoked Lung Cancer Screening Low Dose Lung CT every 12 months Age 63-80 years with a 30 pack-year smoking history who still smoke or who have quit within the last 15 years  Screening Labs Routine  Labs: Complete Blood Count (CBC), Complete Metabolic Panel (CMP), Cholesterol (Lipid Panel) Every 6-12 months based on history and medications May be recommended more frequently based on current conditions or previous results Hemoglobin A1c Lab Every 3-12 months based on history and previous results Starting at age 24 or earlier with diagnosis of diabetes, high cholesterol, BMI >26, and/or risk factors Frequent monitoring for patients with diabetes to ensure blood sugar control Thyroid  Panel (TSH w/ T3 & T4) Every 6 months based on history, symptoms, and risk factors May be repeated more often if on  medication HIV One time testing for all patients 13 and older May be repeated more frequently for patients with increased risk factors or exposure Hepatitis C One time testing for all patients 8 and older May be repeated more frequently for patients with increased risk factors or exposure Gonorrhea, Chlamydia Every 12 months for all sexually active persons 13-24 years Additional monitoring may be recommended for those who are considered high risk or who have symptoms PSA Men 61-54 years  old with risk factors Additional screening may be recommended from age 45-69 based on risk factors, symptoms, and history  Vaccine Recommendations Tetanus Booster All adults every 10 years Flu Vaccine All patients 6 months and older every year COVID Vaccine All patients 12 years and older Initial dosing with booster May recommend additional booster based on age and health history HPV Vaccine 2 doses all patients age 31-26 Dosing may be considered for patients over 26 Shingles Vaccine (Shingrix) 2 doses all adults 55 years and older Pneumonia (Pneumovax 23) All adults 65 years and older May recommend earlier dosing based on health history Pneumonia (Prevnar 67) All adults 65 years and older Dosed 1 year after Pneumovax 23  Additional Screening, Testing, and Vaccinations may be recommended on an individualized basis based on family history, health history, risk factors, and/or exposure.

## 2024-01-03 NOTE — Assessment & Plan Note (Signed)
 Experiences severe leg cramps, likely due to dehydration. Struggles with water intake and has been advised to try flavored water options. Started taking magnesium to help with cramps. - Encourage increased water intake with flavored options like Propel or Bubbly. - Advise taking magnesium at bedtime to help with cramps.

## 2024-01-03 NOTE — Assessment & Plan Note (Signed)
 Reports black, sticky stools and occasional burning sensation in the stomach, which may indicate an upper GI ulcer. Consuming spicy foods and carbonated drinks, which may exacerbate symptoms. - Refer to GI specialist for further evaluation and possible colonoscopy. - Advise on dietary modifications to avoid spicy foods and carbonated drinks.

## 2024-01-03 NOTE — Assessment & Plan Note (Signed)

## 2024-01-03 NOTE — Assessment & Plan Note (Signed)
 Experiences sporadic menstrual bleeding despite having had an endometrial ablation. Concerned about the possibility of pregnancy as she has not had a tubal ligation, although one tube was removed due to an ectopic pregnancy. - Discuss the possibility of pregnancy and the need for further evaluation if menstrual irregularity continues.

## 2024-01-03 NOTE — Progress Notes (Signed)
 Catheline Doing, DNP, AGNP-c [facility] [address] [place], [place] [address] Main Office [phone] VISIT TYPE: CPE on 01/03/2024 Today's Vitals   01/03/24 1334  BP: 124/82  Pulse: 64  Weight: 184 lb (83.5 kg)  Height: 5' 2 (1.575 m)   Body mass index is 33.65 kg/m. BP 124/82   Pulse 64   Ht 5' 2 (1.575 m)   Wt 184 lb (83.5 kg)   BMI 33.65 kg/m   Subjective:    Patient ID: Katrina Howe, female    DOB: 1970/05/23, 53 y.o.   MRN: [id number]  HPI:  History of Present Illness Katrina Howe is a 53 year old female who presents with leg cramps and hydration issues.  She experiences severe leg cramps, described as the worst of her life, primarily occurring behind the knee. She struggles to drink water despite attempts to flavor it with products like Crystal Light. She has been trying to avoid soda but finds it challenging. She takes magnesium supplements at bedtime to help with the cramps.  She is on a medication regimen involving injections, currently at a dose of 7.5 mg, which she has tolerated well. However, she notes scarring and itching at the injection sites on her stomach. She is considering increasing the dose due to cravings for sweets.  She has a history of rectocele, which causes difficulty with bowel movements, particularly when sitting. She uses over-the-counter products to aid with bowel movements. She also reports black, sticky stools and occasional burning with certain foods, suggesting possible gastrointestinal issues. She has not had a Cologuard test or colonoscopy yet.  She has a history of menstrual irregularities following an ablation procedure. Despite the procedure, she experiences sporadic menstrual bleeding, with the last occurrence about three weeks ago. She has not had a tubal ligation but had one tube removed due to an ectopic pregnancy.  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 She describes feeling anxious about performing well and managing her workload. She also reports fatigue, which she attributes to a busy work schedule and recent vacation activities. She denies chest pain, shortness of breath, and dizziness. She reports difficulty with bowel movements related to her rectocele.  Pertinent items are noted in HPI.  Most Recent Depression Screen:     01/03/2024    1:32 PM 04/25/2023   11:54 AM 08/30/2022    3:25 PM  Depression screen PHQ 2/9  Decreased Interest 0 0 0  Down, Depressed, Hopeless 0 0 0  PHQ - 2 Score 0 0 0   Most Recent Anxiety Screen:      No data to display         Most Recent Fall Screen:    01/03/2024    1:32 PM 04/25/2023   11:54 AM 08/30/2022    3:23 PM  Fall Risk   Falls in the past year? 0 0 0  Number falls in past yr: 0 0 0  Injury with Fall? 0 0 0  Risk for fall due to : No Fall Risks No Fall Risks No Fall Risks  Follow up Falls evaluation completed Falls evaluation completed Falls evaluation completed    Past medical history, surgical history, medications, allergies, family history and social history reviewed with patient today and changes made to appropriate areas of the chart.  Past Medical History:  Past Medical History:  Diagnosis Date   Chest pain 12/18/2013  Chronic cough    Cough 11/24/2017   DOE (dyspnea on exertion) 11/24/2017   Glucose intolerance    Leg edema    Morbid obesity (HCC)    Swelling 11/24/2017   Medications:  Current Outpatient Medications on File Prior to Visit  Medication Sig   mometasone  (ELOCON ) 0.1 % cream Apply thin layer twice a day for rash after injection. (Patient not taking: Reported on 01/03/2024)   No current facility-administered medications on file prior to visit.   Surgical History:  History reviewed. No pertinent surgical history. Allergies:  Allergies  Allergen Reactions   Iohexol Hives and Swelling     Code: HIVES,  Desc: Pt.had 100ml omnipaque 300% IV injection (rt.antecubital site) around 12:35pm for CT CHEST,ABDOMEN,PELVIS scan. Left side of upper lip started to swell immediately after scan. Dr. Devere Bihari had prescibed and dosed pt. with 50mg . benadryl p.o. around 13, Onset Date: [id number]    Family History:  Family History  Problem Relation Age of Onset   Heart disease Mother    Heart failure Maternal Grandmother    Heart disease Son        Left ventricular non-compaction       Objective:    BP 124/82   Pulse 64   Ht 5' 2 (1.575 m)   Wt 184 lb (83.5 kg)   BMI 33.65 kg/m   Wt Readings from Last 3 Encounters:  01/03/24 184 lb (83.5 kg)  04/25/23 200 lb 3.2 oz (90.8 kg)  08/30/22 221 lb 3.2 oz (100.3 kg)    Physical Exam Vitals and nursing note reviewed.  Constitutional:      General: She is not in acute distress.    Appearance: Normal appearance.  HENT:     Head: Normocephalic and atraumatic.     Right Ear: Hearing, tympanic membrane, ear canal and external ear normal.     Left Ear: Hearing, tympanic membrane, ear canal and external ear normal.     Nose: Nose normal.     Right Sinus: No maxillary sinus tenderness or frontal sinus tenderness.     Left Sinus: No maxillary sinus tenderness or frontal sinus tenderness.     Mouth/Throat:     Lips: Pink.     Mouth: Mucous membranes are moist.     Pharynx: Oropharynx is clear.  Eyes:     General: Lids are normal. Vision grossly intact.     Extraocular Movements: Extraocular movements intact.     Conjunctiva/sclera: Conjunctivae normal.     Pupils: Pupils are equal, round, and reactive to light.     Funduscopic exam:    Right eye: Red reflex present.        Left eye: Red reflex present.    Visual Fields: Right eye visual fields normal and left eye visual fields normal.  Neck:     Thyroid : No thyromegaly.     Vascular: No carotid bruit.  Cardiovascular:     Rate and Rhythm: Normal rate and regular rhythm.     Chest Wall: PMI  is not displaced.     Pulses: Normal pulses.          Dorsalis pedis pulses are 2+ on the right side and 2+ on the left side.       Posterior tibial pulses are 2+ on the right side and 2+ on the left side.     Heart sounds: Normal heart sounds. No murmur heard. Pulmonary:     Effort: Pulmonary effort is normal. No respiratory distress.  Breath sounds: Normal breath sounds.  Abdominal:     General: Abdomen is flat. Bowel sounds are normal. There is no distension.     Palpations: Abdomen is soft. There is no hepatomegaly, splenomegaly or mass.     Tenderness: There is no abdominal tenderness. There is no right CVA tenderness, left CVA tenderness, guarding or rebound.  Musculoskeletal:        General: Normal range of motion.     Cervical back: Full passive range of motion without pain, normal range of motion and neck supple. No tenderness.     Right lower leg: No edema.     Left lower leg: No edema.  Feet:     Left foot:     Toenail Condition: Left toenails are normal.  Lymphadenopathy:     Cervical: No cervical adenopathy.     Upper Body:     Right upper body: No supraclavicular adenopathy.     Left upper body: No supraclavicular adenopathy.  Skin:    General: Skin is warm and dry.     Capillary Refill: Capillary refill takes less than 2 seconds.     Nails: There is no clubbing.  Neurological:     General: No focal deficit present.     Mental Status: She is alert and oriented to person, place, and time.     GCS: GCS eye subscore is 4. GCS verbal subscore is 5. GCS motor subscore is 6.     Sensory: Sensation is intact.     Motor: Motor function is intact.     Coordination: Coordination is intact.     Gait: Gait is intact.     Deep Tendon Reflexes: Reflexes are normal and symmetric.  Psychiatric:        Attention and Perception: Attention normal.        Mood and Affect: Mood normal.        Speech: Speech normal.        Behavior: Behavior normal. Behavior is cooperative.         Thought Content: Thought content normal.        Cognition and Memory: Cognition and memory normal.        Judgment: Judgment normal.     Results for orders placed or performed in visit on 04/25/23  Hemoglobin A1c   Collection Time: 04/25/23  2:09 PM  Result Value Ref Range   Hgb A1c MFr Bld 6.3 (H) 4.8 - 5.6 %   Est. average glucose Bld gHb Est-mCnc 134 mg/dL  CBC with Differential/Platelet   Collection Time: 04/25/23  2:09 PM  Result Value Ref Range   WBC 6.6 3.4 - 10.8 x10E3/uL   RBC 4.82 3.77 - 5.28 x10E6/uL   Hemoglobin 12.5 11.1 - 15.9 g/dL   Hematocrit 58.9 65.9 - 46.6 %   MCV 85 79 - 97 fL   MCH 25.9 (L) 26.6 - 33.0 pg   MCHC 30.5 (L) 31.5 - 35.7 g/dL   RDW 85.4 88.2 - 84.5 %   Platelets 459 (H) 150 - 450 x10E3/uL   Neutrophils 45 Not Estab. %   Lymphs 42 Not Estab. %   Monocytes 9 Not Estab. %   Eos 3 Not Estab. %   Basos 1 Not Estab. %   Neutrophils Absolute 3.0 1.4 - 7.0 x10E3/uL   Lymphocytes Absolute 2.7 0.7 - 3.1 x10E3/uL   Monocytes Absolute 0.6 0.1 - 0.9 x10E3/uL   EOS (ABSOLUTE) 0.2 0.0 - 0.4 x10E3/uL   Basophils Absolute 0.1 0.0 -  0.2 x10E3/uL   Immature Granulocytes 0 Not Estab. %   Immature Grans (Abs) 0.0 0.0 - 0.1 x10E3/uL  Comprehensive metabolic panel   Collection Time: 04/25/23  2:09 PM  Result Value Ref Range   Glucose 99 70 - 99 mg/dL   BUN 7 6 - 24 mg/dL   Creatinine, Ser 9.16 0.57 - 1.00 mg/dL   eGFR 84 >40 fO/fpw/8.26   BUN/Creatinine Ratio 8 (L) 9 - 23   Sodium 143 134 - 144 mmol/L   Potassium 4.4 3.5 - 5.2 mmol/L   Chloride 105 96 - 106 mmol/L   CO2 23 20 - 29 mmol/L   Calcium 9.8 8.7 - 10.2 mg/dL   Total Protein 7.5 6.0 - 8.5 g/dL   Albumin 4.4 3.8 - 4.9 g/dL   Globulin, Total 3.1 1.5 - 4.5 g/dL   Bilirubin Total 0.3 0.0 - 1.2 mg/dL   Alkaline Phosphatase 96 44 - 121 IU/L   AST 16 0 - 40 IU/L   ALT 15 0 - 32 IU/L  VITAMIN D  25 Hydroxy (Vit-D Deficiency, Fractures)   Collection Time: 04/25/23  2:09 PM  Result Value Ref Range    Vit D, 25-Hydroxy 12.8 (L) 30.0 - 100.0 ng/mL       Assessment & Plan:   Problem List Items Addressed This Visit     Moderate obesity   Experiencing challenges with weight management, including difficulty drinking water and cravings for sweets. Currently on semaglutide  7.5 mg and tolerating it well. Reports scarring and itching at the injection site. Engaging in exercise and managing diet by eating at specific times and reducing soda intake. - Increase semaglutide  to 10 mg. - Encourage hydration with alternatives like Propel or Bubbly to improve water intake. - Advise on the benefits of exercise and maintaining a balanced diet. - Discuss the importance of gradual weight loss to avoid skin sagging.      Relevant Medications   tirzepatide  (MOUNJARO ) 10 MG/0.5ML Pen   Other Relevant Orders   Hemoglobin A1c   CBC with Differential/Platelet   Comprehensive metabolic panel with GFR   Lipid panel   Type 2 diabetes mellitus with hyperglycemia (HCC)   Blood pressure is well-controlled, and she has lost weight, which is beneficial for diabetes management. Currently on 7.5mg  of Mounjaro  and we will increase this today to 10mg . No alarm symptoms present.  - Check A1c to monitor diabetes control. - Increase Mounjaro  to 10mg        Relevant Medications   tirzepatide  (MOUNJARO ) 10 MG/0.5ML Pen   Other Relevant Orders   Microalbumin / creatinine urine ratio   Hemoglobin A1c   CBC with Differential/Platelet   Comprehensive metabolic panel with GFR   Lipid panel   Perimenopause   Experiences sporadic menstrual bleeding despite having had an endometrial ablation. Concerned about the possibility of pregnancy as she has not had a tubal ligation, although one tube was removed due to an ectopic pregnancy. - Discuss the possibility of pregnancy and the need for further evaluation if menstrual irregularity continues.      Encounter for annual physical exam - Primary   CPE completed today. Review  of HM activities and recommendations discussed and provided on AVS. Anticipatory guidance, diet, and exercise recommendations provided. Medications, allergies, and hx reviewed and updated as necessary. Orders placed as listed below.  Plan: - Labs ordered. Will make changes as necessary based on results.  - I will review these results and send recommendations via MyChart or a telephone call.  -  F/U with CPE in 1 year or sooner for acute/chronic health needs as directed.        Dark stools   Reports black, sticky stools and occasional burning sensation in the stomach, which may indicate an upper GI ulcer. Consuming spicy foods and carbonated drinks, which may exacerbate symptoms. - Refer to GI specialist for further evaluation and possible colonoscopy. - Advise on dietary modifications to avoid spicy foods and carbonated drinks.      Relevant Orders   Ambulatory referral to Gastroenterology   Leg cramps   Experiences severe leg cramps, likely due to dehydration. Struggles with water intake and has been advised to try flavored water options. Started taking magnesium to help with cramps. - Encourage increased water intake with flavored options like Propel or Bubbly. - Advise taking magnesium at bedtime to help with cramps.      Other Visit Diagnoses       Screening for colon cancer       Relevant Orders   Ambulatory referral to Gastroenterology       Follow up plan: Return in about 6 months (around 07/02/2024) for Med Management 30.  NEXT PREVENTATIVE PHYSICAL DUE IN 1 YEAR.  PATIENT COUNSELING PROVIDED FOR ALL ADULT PATIENTS: A well balanced diet low in saturated fats, cholesterol, and moderation in carbohydrates.  This can be as simple as monitoring portion sizes and cutting back on sugary beverages such as soda and juice to start with.    Daily water consumption of at least 64 ounces.  Physical activity at least 180 minutes per week.  If just starting out, start 10 minutes a  day and work your way up.   This can be as simple as taking the stairs instead of the elevator and walking 2-3 laps around the office  purposefully every day.   STD protection, partner selection, and regular testing if high risk.  Limited consumption of alcoholic beverages if alcohol is consumed. For men, I recommend no more than 14 alcoholic beverages per week, spread out throughout the week (max 2 per day). Avoid binge drinking or consuming large quantities of alcohol in one setting.  Please let me know if you feel you may need help with reduction or quitting alcohol consumption.   Avoidance of nicotine, if used. Please let me know if you feel you may need help with reduction or quitting nicotine use.   Daily mental health attention. This can be in the form of 5 minute daily meditation, prayer, journaling, yoga, reflection, etc.  Purposeful attention to your emotions and mental state can significantly improve your overall wellbeing  and  Health.  Please know that I am here to help you with all of your health care goals and am happy to work with you to find a solution that works best for you.  The greatest advice I have received with any changes in life are to take it one step at a time, that even means if all you can focus on is the next 60 seconds, then do that and celebrate your victories.  With any changes in life, you will have set backs, and that is OK. The important thing to remember is, if you have a set back, it is not a failure, it is an opportunity to try again! Screening Testing Mammogram Every 1 -2 years based on history and risk factors Starting at age 1 Pap Smear Ages 21-39 every 3 years Ages 49-65 every 5 years with HPV testing More frequent testing may  be required based on results and history Colon Cancer Screening Every 1-10 years based on test performed, risk factors, and history Starting at age 79 Bone Density Screening Every 2-10 years based on  history Starting at age 76 for women Recommendations for men differ based on medication usage, history, and risk factors AAA Screening One time ultrasound Men 51-89 years old who have every smoked Lung Cancer Screening Low Dose Lung CT every 12 months Age 17-80 years with a 30 pack-year smoking history who still smoke or who have quit within the last 15 years   Screening Labs Routine  Labs: Complete Blood Count (CBC), Complete Metabolic Panel (CMP), Cholesterol (Lipid Panel) Every 6-12 months based on history and medications May be recommended more frequently based on current conditions or previous results Hemoglobin A1c Lab Every 3-12 months based on history and previous results Starting at age 69 or earlier with diagnosis of diabetes, high cholesterol, BMI >26, and/or risk factors Frequent monitoring for patients with diabetes to ensure blood sugar control Thyroid  Panel (TSH) Every 6 months based on history, symptoms, and risk factors May be repeated more often if on medication HIV One time testing for all patients 9 and older May be repeated more frequently for patients with increased risk factors or exposure Hepatitis C One time testing for all patients 75 and older May be repeated more frequently for patients with increased risk factors or exposure Gonorrhea, Chlamydia Every 12 months for all sexually active persons 13-24 years Additional monitoring may be recommended for those who are considered high risk or who have symptoms Every 12 months for any woman on birth control, regardless of sexual activity PSA Men 48-24 years old with risk factors Additional screening may be recommended from age 50-69 based on risk factors, symptoms, and history  Vaccine Recommendations Tetanus Booster All adults every 10 years Flu Vaccine All patients 6 months and older every year COVID Vaccine All patients 12 years and older Initial dosing with booster May recommend additional booster  based on age and health history HPV Vaccine 2 doses all patients age 104-26 Dosing may be considered for patients over 26 Shingles Vaccine (Shingrix) 2 doses all adults 55 years and older Pneumonia (Pneumovax 27) All adults 65 years and older May recommend earlier dosing based on health history One year apart from Prevnar 58 Pneumonia (Prevnar 72) All adults 65 years and older Dosed 1 year after Pneumovax 23 Pneumonia (Prevnar 20) One time alternative to the two dosing of 13 and 23 For all adults with initial dose of 23, 20 is recommended 1 year later For all adults with initial dose of 13, 23 is still recommended as second option 1 year later

## 2024-01-03 NOTE — Assessment & Plan Note (Signed)
 Blood pressure is well-controlled, and she has lost weight, which is beneficial for diabetes management. Currently on 7.5mg  of Mounjaro  and we will increase this today to 10mg . No alarm symptoms present.  - Check A1c to monitor diabetes control. - Increase Mounjaro  to 10mg 

## 2024-01-04 LAB — MICROALBUMIN / CREATININE URINE RATIO
Creatinine, Urine: 204.2 mg/dL
Microalb/Creat Ratio: 12 mg/g{creat} (ref 0–29)
Microalbumin, Urine: 24.7 ug/mL

## 2024-01-04 LAB — COMPREHENSIVE METABOLIC PANEL WITH GFR
ALT: 21 IU/L (ref 0–32)
AST: 26 IU/L (ref 0–40)
Albumin: 4.3 g/dL (ref 3.8–4.9)
Alkaline Phosphatase: 76 IU/L (ref 44–121)
BUN/Creatinine Ratio: 17 (ref 9–23)
BUN: 11 mg/dL (ref 6–24)
Bilirubin Total: <0.2 mg/dL (ref 0.0–1.2)
CO2: 23 mmol/L (ref 20–29)
Calcium: 9.5 mg/dL (ref 8.7–10.2)
Chloride: 102 mmol/L (ref 96–106)
Creatinine, Ser: 0.65 mg/dL (ref 0.57–1.00)
Globulin, Total: 3.4 g/dL (ref 1.5–4.5)
Glucose: 84 mg/dL (ref 70–99)
Potassium: 4.2 mmol/L (ref 3.5–5.2)
Sodium: 138 mmol/L (ref 134–144)
Total Protein: 7.7 g/dL (ref 6.0–8.5)
eGFR: 105 mL/min/1.73 (ref >59)

## 2024-01-04 LAB — LIPID PANEL
Cholesterol, Total: 178 mg/dL (ref 100–199)
HDL: 48 mg/dL (ref >39)
LDL CALC COMMENT:: 3.7 ratio (ref 0.0–4.4)
LDL Chol Calc (NIH): 98 mg/dL (ref 0–99)
Triglycerides: 183 mg/dL — AB (ref 0–149)
VLDL Cholesterol Cal: 32 mg/dL (ref 5–40)

## 2024-01-04 LAB — HEMOGLOBIN A1C
Est. average glucose Bld gHb Est-mCnc: 123 mg/dL
Hgb A1c MFr Bld: 5.9 — AB (ref 4.8–5.6)

## 2024-01-04 LAB — CBC WITH DIFFERENTIAL/PLATELET
Basophils Absolute: 0 x10E3/uL (ref 0.0–0.2)
Basos: 1 %
EOS (ABSOLUTE): 0.2 x10E3/uL (ref 0.0–0.4)
Eos: 4 %
Hematocrit: 40 % (ref 34.0–46.6)
Hemoglobin: 12.8 g/dL (ref 11.1–15.9)
Immature Grans (Abs): 0 x10E3/uL (ref 0.0–0.1)
Immature Granulocytes: 0 %
Lymphocytes Absolute: 2.5 x10E3/uL (ref 0.7–3.1)
Lymphs: 60 %
MCH: 27.5 pg (ref 26.6–33.0)
MCHC: 32 g/dL (ref 31.5–35.7)
MCV: 86 fL (ref 79–97)
Monocytes Absolute: 0.4 x10E3/uL (ref 0.1–0.9)
Monocytes: 10 %
Neutrophils Absolute: 1 x10E3/uL — ABNORMAL LOW (ref 1.4–7.0)
Neutrophils: 25 %
Platelets: 396 x10E3/uL (ref 150–450)
RBC: 4.65 x10E6/uL (ref 3.77–5.28)
RDW: 15.2 % (ref 11.7–15.4)
WBC: 4.2 x10E3/uL (ref 3.4–10.8)

## 2024-01-07 ENCOUNTER — Other Ambulatory Visit (HOSPITAL_COMMUNITY): Payer: Self-pay

## 2024-01-08 ENCOUNTER — Ambulatory Visit: Payer: Self-pay | Admitting: Nurse Practitioner

## 2024-01-08 ENCOUNTER — Other Ambulatory Visit (HOSPITAL_COMMUNITY): Payer: Self-pay

## 2024-01-08 ENCOUNTER — Telehealth: Payer: Self-pay

## 2024-01-08 NOTE — Telephone Encounter (Signed)
 Pharmacy Patient Advocate Encounter   Received notification from Onbase that prior authorization for Mounjaro  10MG /0.5ML auto-injectors  is required/requested.   Insurance verification completed.   The patient is insured through [facility] .   Per test claim: PA required; PA submitted to above mentioned insurance via Latent Key/confirmation #/[id number]  Status is pending

## 2024-01-09 ENCOUNTER — Other Ambulatory Visit (HOSPITAL_COMMUNITY): Payer: Self-pay

## 2024-01-09 NOTE — Telephone Encounter (Signed)
 Pharmacy Patient Advocate Encounter  Received notification from [employer] that Prior Authorization for  Mounjaro  10MG /0.5ML auto-injectors has been APPROVED to 9.17.26. Ran test claim, Copay is $RTS, RX WAS LAST FILLED ON 9.12.25. This test claim was processed through [facility]- copay amounts may vary at other pharmacies due to pharmacy/plan contracts, or as the patient moves through the different stages of their insurance plan.   PA #/Case ID/Reference #: [id number]

## 2024-01-30 ENCOUNTER — Other Ambulatory Visit (HOSPITAL_COMMUNITY): Payer: Self-pay

## 2024-03-30 ENCOUNTER — Other Ambulatory Visit (HOSPITAL_COMMUNITY): Payer: Self-pay

## 2024-03-30 ENCOUNTER — Other Ambulatory Visit: Payer: Self-pay | Admitting: Nurse Practitioner

## 2024-03-30 DIAGNOSIS — E1165 Type 2 diabetes mellitus with hyperglycemia: Secondary | ICD-10-CM

## 2024-03-30 DIAGNOSIS — E669 Obesity, unspecified: Secondary | ICD-10-CM

## 2024-03-30 MED ORDER — MOUNJARO 10 MG/0.5ML ~~LOC~~ SOAJ
10.0000 mg | SUBCUTANEOUS | 2 refills | Status: AC
  Filled 2024-03-30 – 2024-04-13 (×2): qty 2, 28d supply, fill #0
  Filled 2024-05-07: qty 2, 28d supply, fill #1
  Filled 2024-05-13: qty 2, 28d supply, fill #0

## 2024-04-09 ENCOUNTER — Other Ambulatory Visit (HOSPITAL_COMMUNITY): Payer: Self-pay

## 2024-04-13 ENCOUNTER — Other Ambulatory Visit (HOSPITAL_COMMUNITY): Payer: Self-pay

## 2024-05-07 ENCOUNTER — Encounter (HOSPITAL_COMMUNITY): Payer: Self-pay

## 2024-05-07 ENCOUNTER — Other Ambulatory Visit (HOSPITAL_COMMUNITY): Payer: Self-pay

## 2024-05-13 ENCOUNTER — Other Ambulatory Visit (HOSPITAL_COMMUNITY): Payer: Self-pay

## 2024-07-02 ENCOUNTER — Ambulatory Visit: Payer: Self-pay | Admitting: Nurse Practitioner

## 2025-01-21 ENCOUNTER — Encounter: Payer: Self-pay | Admitting: Nurse Practitioner
